# Patient Record
Sex: Male | Born: 1935 | Race: White | Hispanic: No | Marital: Married | Smoking: Current every day smoker
Health system: Southern US, Community
[De-identification: ages and names within clinical notes are randomized; demographics above are authoritative.]

## PROBLEM LIST (undated history)

## (undated) DIAGNOSIS — E119 Type 2 diabetes mellitus without complications: Secondary | ICD-10-CM

## (undated) DIAGNOSIS — M21922 Unspecified acquired deformity of left upper arm: Secondary | ICD-10-CM

## (undated) DIAGNOSIS — E559 Vitamin D deficiency, unspecified: Secondary | ICD-10-CM

## (undated) DIAGNOSIS — F17200 Nicotine dependence, unspecified, uncomplicated: Secondary | ICD-10-CM

## (undated) DIAGNOSIS — J449 Chronic obstructive pulmonary disease, unspecified: Secondary | ICD-10-CM

## (undated) DIAGNOSIS — W19XXXA Unspecified fall, initial encounter: Secondary | ICD-10-CM

## (undated) DIAGNOSIS — S72001D Fracture of unspecified part of neck of right femur, subsequent encounter for closed fracture with routine healing: Secondary | ICD-10-CM

## (undated) DIAGNOSIS — I639 Cerebral infarction, unspecified: Secondary | ICD-10-CM

## (undated) DIAGNOSIS — S32401D Unspecified fracture of right acetabulum, subsequent encounter for fracture with routine healing: Secondary | ICD-10-CM

## (undated) DIAGNOSIS — S5291XD Unspecified fracture of right forearm, subsequent encounter for closed fracture with routine healing: Secondary | ICD-10-CM

---

## 1999-06-07 ENCOUNTER — Encounter: Payer: Self-pay | Admitting: Family Medicine

## 1999-06-07 ENCOUNTER — Ambulatory Visit (HOSPITAL_COMMUNITY): Admission: RE | Admit: 1999-06-07 | Discharge: 1999-06-07 | Payer: Self-pay | Admitting: Family Medicine

## 2003-12-13 HISTORY — PX: INTRAMEDULLARY (IM) NAIL INTERTROCHANTERIC: SHX5875

## 2004-01-13 ENCOUNTER — Inpatient Hospital Stay (HOSPITAL_COMMUNITY): Admission: EM | Admit: 2004-01-13 | Discharge: 2004-01-20 | Payer: Self-pay | Admitting: Emergency Medicine

## 2007-08-20 ENCOUNTER — Ambulatory Visit: Payer: Self-pay | Admitting: Vascular Surgery

## 2007-08-20 ENCOUNTER — Encounter (INDEPENDENT_AMBULATORY_CARE_PROVIDER_SITE_OTHER): Payer: Self-pay | Admitting: Internal Medicine

## 2007-08-20 ENCOUNTER — Ambulatory Visit: Payer: Self-pay | Admitting: Cardiology

## 2007-08-20 ENCOUNTER — Inpatient Hospital Stay (HOSPITAL_COMMUNITY): Admission: EM | Admit: 2007-08-20 | Discharge: 2007-08-28 | Payer: Self-pay | Admitting: Emergency Medicine

## 2007-08-28 ENCOUNTER — Ambulatory Visit: Payer: Self-pay | Admitting: Physical Medicine & Rehabilitation

## 2007-08-28 ENCOUNTER — Inpatient Hospital Stay (HOSPITAL_COMMUNITY)
Admission: RE | Admit: 2007-08-28 | Discharge: 2007-09-07 | Payer: Self-pay | Admitting: Physical Medicine & Rehabilitation

## 2007-09-11 ENCOUNTER — Encounter
Admission: RE | Admit: 2007-09-11 | Discharge: 2007-11-01 | Payer: Self-pay | Admitting: Physical Medicine & Rehabilitation

## 2007-10-09 ENCOUNTER — Encounter
Admission: RE | Admit: 2007-10-09 | Discharge: 2008-01-07 | Payer: Self-pay | Admitting: Physical Medicine & Rehabilitation

## 2007-10-09 ENCOUNTER — Ambulatory Visit: Payer: Self-pay | Admitting: Physical Medicine & Rehabilitation

## 2007-11-30 ENCOUNTER — Ambulatory Visit: Payer: Self-pay | Admitting: Physical Medicine & Rehabilitation

## 2008-01-06 ENCOUNTER — Ambulatory Visit: Payer: Self-pay | Admitting: Internal Medicine

## 2008-01-28 ENCOUNTER — Encounter
Admission: RE | Admit: 2008-01-28 | Discharge: 2008-01-29 | Payer: Self-pay | Admitting: Physical Medicine & Rehabilitation

## 2008-01-28 ENCOUNTER — Ambulatory Visit: Payer: Self-pay | Admitting: Physical Medicine & Rehabilitation

## 2008-02-21 ENCOUNTER — Ambulatory Visit: Payer: Self-pay | Admitting: Internal Medicine

## 2008-07-17 ENCOUNTER — Encounter
Admission: RE | Admit: 2008-07-17 | Discharge: 2008-07-21 | Payer: Self-pay | Admitting: Physical Medicine & Rehabilitation

## 2008-07-21 ENCOUNTER — Ambulatory Visit: Payer: Self-pay | Admitting: Physical Medicine & Rehabilitation

## 2008-07-28 ENCOUNTER — Encounter
Admission: RE | Admit: 2008-07-28 | Discharge: 2008-10-02 | Payer: Self-pay | Admitting: Physical Medicine & Rehabilitation

## 2008-11-20 ENCOUNTER — Encounter
Admission: RE | Admit: 2008-11-20 | Discharge: 2008-11-21 | Payer: Self-pay | Admitting: Physical Medicine & Rehabilitation

## 2008-11-21 ENCOUNTER — Ambulatory Visit: Payer: Self-pay | Admitting: Physical Medicine & Rehabilitation

## 2008-12-10 ENCOUNTER — Emergency Department (HOSPITAL_COMMUNITY): Admission: EM | Admit: 2008-12-10 | Discharge: 2008-12-10 | Payer: Self-pay | Admitting: Emergency Medicine

## 2011-01-03 ENCOUNTER — Encounter: Payer: Self-pay | Admitting: Physical Medicine & Rehabilitation

## 2011-04-26 NOTE — Assessment & Plan Note (Signed)
Johnathan [name] is back regarding his left PCA stroke.  He has been taking on some  more responsibilities at home.  He has been doing work around the house  and outside.  He still has some problems with memory and awareness, but  overall is improved.  Vision has not dramatically changed.  Wife noted  some improvement with Aricept and even before he started taking the  Aricept.  He denies any pain today.  He still smokes.   REVIEW OF SYSTEMS:  Notable for the above.  Full review is in the  written health and history section in the chart.   SOCIAL HISTORY:  The patient is here with his wife who is supportive.   PHYSICAL EXAMINATION:  VITAL SIGNS:  Blood pressure is 124/59, pulse is  86, respiratory rate is 18, and he is sating 99% on room air.  GENERAL:  The patient is pleasant, alert, and oriented x3.  Affect is  bright and appropriate.  Balance is stable.  He still has some issues to  the left today.  Vision remains impaired to the right with 5 or 10  degrees intact past midline.  HEART:  Regular.  CHEST:  Clear.  ABDOMEN:  Soft and nontender.  The patient does better memory and  insight, but continues to have problems with sequencing and overall  awareness.  He likes to laugh his way through a lot of questions and  answers.   ASSESSMENT:  Left posterior cerebral artery stroke.   PLAN:  1. Continue Aricept 5 mg at bedtime.  2. Encouraged problem solving activities at home to push his memory      and cognition.  3. Could benefit from prism lenses for glasses, but these will not      give him the ability to drive.  4. I will see him back on as needed basis.  He will follow up with Dr.      Jeannetta Nap for his ongoing medical needs.      Ranelle Oyster, M.D.  Electronically Signed     ZTS/MedQ  D:  11/21/2008 15:35:19  T:  11/22/2008 03:02:30  Job #:  [id number]   cc:   Windle Guard, M.D.  Fax: [phone]

## 2011-04-26 NOTE — H&P (Signed)
NAMETANMAY, HALTEMAN NO.:  [id number]   MEDICAL RECORD NO.:  [id number]          PATIENT TYPE:  INP   LOCATION:  2001                         FACILITY:  [facility]   PHYSICIAN:  Della Goo, M.D. DATE OF BIRTH:  05-Apr-1936   DATE OF ADMISSION:  08/20/2007  DATE OF DISCHARGE:                              HISTORY & PHYSICAL   CHIEF COMPLAINT:  Confusion.   HISTORY OF PRESENT ILLNESS:  This is a 75 year old man who presented to  the emergency department secondary to complaints of severe headache and  confusion which started at about noon.  His wife is with him and  describes that he began to have a glazed look and was confused.  The  patient's wife reports that she thought his blood sugar may have been  low, so she fed him orange juice.  She did check his blood sugar and it  was found to be 176 at that time.  She also reports that he had anomia  and was unable to recognize simple things.  He also had apraxia.  She  attempted to persuade him to go to the hospital, however the patient  refused to go.  He did not report to the hospital until the next day.  His symptoms did not improve.  The patient denies having any problems  with his balance.  He does report having problems with his memory.  He  denies having any weakness in his arms or legs.  The patient reported  having a severe headache in the frontal area, throbbing.  He reports  taking 1000 mg of Tylenol three times during the past 24 hours without  relief.  He also took seven to eight 81 mg aspirin which were without  relief as well.  He denies having any chest pain or shortness of breath,  however does report having nausea and nausea or vomiting.  He denies  having any bowel or bladder changes.   PAST MEDICAL HISTORY:  1. Significant for two previous cerebrovascular accidents.  2. Type 2 diabetes mellitus.  3. Glaucoma.  4. Hyperlipidemia.  5. Neuropathy.  6. Spinal stenosis.   PAST SURGICAL HISTORY:  1. Significant for right hip repair.  2. History of repair of the left wrist drop.  This is secondary to      what sounds like Erb's palsy.   MEDICATIONS:  1. Avandia 8 mg 1 p.o. daily.  2. Altace 1 tablet p.o. daily - the dose needs to be verified.  3. Glucovance 5/500 two p.o. b.i.d.  4. Aspirin 81 mg 1 p.o. daily.  5. Lipitor 10 mg 1 p.o. daily.  6. Gabapentin 400 mg 1 p.o. q.a.m. and 300 mg p.o. at lunch and at      dinner.  7. The patient also has Xalatan eye drops 1 drop in both eyes at      night.  8. Byetta injections twice daily.   SOCIAL HISTORY:  The patient is married.  He is a retired Airline pilot.  He has a remote history of tobacco abuse, quit 7 years ago.  No history  of alcohol usage.   FAMILY HISTORY:  Noncontributory.   REVIEW OF SYSTEMS:  Pertinents are mentioned above.   PHYSICAL EXAMINATION:  GENERAL APPEARANCE:  This is a 75 year old well-  nourished, well-developed male in no acute distress.  VITAL SIGNS:  Stable.  Temperature 97.4, blood pressure 155/85, heart  rate 88, respirations 18, O2 saturation is 96% on room air.  HEENT: Normocephalic, atraumatic.  Pupils equally round reactive to  light.  Extraocular muscles are intact.  Funduscopic benign.  Oropharynx  is clear.  NECK:  Supple.  Full range of motion.  No thyromegaly, adenopathy,  jugular venous distention.  CARDIOVASCULAR:  Regular rate and rhythm.  No murmurs, gallops or rubs.  CHEST:  Lungs are clear to auscultation bilaterally.  ABDOMEN:  Positive bowel sounds, soft, nontender, nondistended.  EXTREMITIES: Without cyanosis, clubbing or edema.  NEUROLOGIC:  The patient is alert x1.  His speech is clear.  His cranial  nerves are intact.  There is no facial drooping.  No tongue deviation.  There are no motor or sensory deficits.  However, a Mini-Mental Status  Examination has been performed, results of which do reveal poor  memory/poor recall.  He also has difficulty with concentrating.  The   patient is an Airline pilot, however has had difficulty with subtraction of  simple numbers.   LABORATORY STUDIES:  White blood cell count 5.3, hemoglobin 11.9,  hematocrit 35.6, MCV 92.9, platelets 150, neutrophils 65% lymphocytes  22%.  Protime 12.7, INR 0.9, PTT 28.  CT scan of the head was performed  revealing acute/subacute cerebrovascular accident of the left posterior  cerebral artery territory affecting the posterior medial temporal lobe  and occipital lobes.  Old small-vessel strokes present.  No hemorrhage.   ASSESSMENT:  66. A 75 year old male being admitted with a subacute cerebrovascular      accident of the left posterior cerebral artery area.  2. Hypertension.  3. Type 2 diabetes mellitus.  4. Hyperlipidemia.  5. Normocytic anemia.   PLAN:  The patient will be admitted to a neurology area for neurologic  monitoring.  Neuro checks have been ordered.  The patient will also have  cardiac monitoring and cardiac enzymes will be performed.  A speech  swallowing evaluation has been ordered.  PT and OT evaluations have also  been ordered as well.  The patient will have further studies.  A carotid  ultrasound study has been ordered along with an MRI and MRA study.  DVT  and GI prophylaxis have been ordered.  His metformin therapy has been  held.  His regular medications have been ordered otherwise and sliding  scale insulin coverage has been ordered as needed for elevated blood  sugars.  A neurology consultation will be placed.      Della Goo, M.D.  Electronically Signed     HJ/MEDQ  D:  08/21/2007  T:  08/21/2007  Job:  [id number]

## 2011-04-26 NOTE — Assessment & Plan Note (Signed)
The patient is back regarding left PCA strokes.  The patient is doing a  bit better as far as his cognition is concerned.  His smoking has taken  a step back.  He is unable to tolerate the Chantix as it caused  depression.  He has backed off the Neurontin a bit.  His wife states  that he is still concerned about his independence and ability to drive.  [name], M.D. had mentioned Nuromax visual field stimulation  program.  Tyrone Apple. Karleen Hampshire, M.D. was in favor of this as a helpful  modality.  The plan is not covered by insurance and costs upwards of  $4,000.   REVIEW OF SYSTEMS:  Notable for the above.  Full review is in the  written health and history section of the chart.   SOCIAL HISTORY:  Without specific change.  The patient is married and  wife works and is very supportive.   PHYSICAL EXAMINATION:  VITAL SIGNS:  Blood pressure 173/78, pulse 90,  respiratory rate 18, and saturations 97% on room air.  GENERAL:  The patient is pleasant, alert to month and year but not date.  He lacks some awareness and insight.  He has inability to focus.  He  likes to joke and play around, but often is hard to keep on task.  HEART:  Regular rate and rhythm.  LUNGS:  Clear.  ABDOMEN:  Soft and nontender.  EXTREMITIES:  The patient has stable strength.  The left arm remains  weaker.  He continues to have a right homonymous hemianopsia.  Sensory  examination is intact.   ASSESSMENT:  Left PCA infarct with right hemisensory loss, mild  hemiparesis, and visual field cut.  The patient with ongoing cognitive  and language deficits.   PLAN:  1. Continue Aricept 10 mg daily.  2. Taper off cortisone as blood pressure is increasing.  He should be      fine off of this.  3. Encourage regular reading and language exercises.  4. I doubt that the Neuromax program for his visual fields would      improve him enough to get to the point where he could drive a motor      vehicle.  He is unsafe to  drive, not only from a visual standpoint,      but from a cognitive language standpoint as well.  5. Encourage more goal oriented behaviors.  6. I will see him back in about six months.  We did recommend Nicoderm      patch along with his inhaler to try for smoking cessation.      Ranelle Oyster, M.D.  Electronically Signed     ZTS/MedQ  D:  01/29/2008 11:07:53  T:  01/29/2008 23:53:50  Job #:  [id number]   cc:   Windle Guard, M.D.  Fax: [phone]

## 2011-04-26 NOTE — Letter (Signed)
February 21, 2008    Johnathan P. Pearlean Brownie, MD  [address]  [place], [place] [address]   RE:  Johnathan [name], Johnathan [name]  MRN:  [id number]  /  DOB:  06-27-1936   Dear Johnathan [name]:   It was a pleasure to see Johnathan [name] at your request because of the  abnormalities on his event recorder.   As you know, he is a 75 year old gentleman who had a stroke in September  2008 that was complicated by significant loss of memory for the  preceding 20 years according to his wife.   In attempts to try to identify the cause of the stroke, he underwent  atrial fibrillation detection monitoring using a CardioNet monitor and  he was found to have 2 arrhythmias prompting this consultation.   He has normal heart function by echo.  He does have a history of  diabetes and hypertension.  He has no known myocardial infarction.  He  has no nocturnal dyspnea,  peripheral edema, orthopnea or dyspnea on  exertion.   PAST MEDICAL HISTORY:  Notable for orthostatic intolerance which is much  improved.  He also had this years ago that was attributed to diabetes  and spinal arterial thrombosis.  In addition to the above history, his  past medical history is notable for gout and fatigue.   PAST SURGICAL HISTORY:  Notable for right hip fracture and surgery of  his left wrist.   SOCIAL HISTORY:  He is married.  He has 1 child.  He does use  cigarettes.  He stopped for a while prior to the stroke and has resumed.  He does not use alcohol or recreational drugs.  He walks.   PHYSICAL EXAMINATION:  VITAL SIGNS:  Blood pressure today was 127/80,  his pulse was 83.  His weight was 191.  GENERAL:  He is an elderly Caucasian male in no acute distress.  He was  quite anxious and described feeling like his heart was fluttering away  every time I walked into the room, but also before I got to the room.  HEENT:  Demonstrated no icterus or xanthoma.  NECK:  Veins were flat.  The carotids are brisk and full bilaterally  without bruits.  BACK:  Without kyphosis or scoliosis.  LUNGS:  Clear.  HEART:  Sounds were regular with an S4.  ABDOMEN:  Soft with active bowel sounds without midline pulsation or  hepatomegaly.  EXTREMITIES:  Femoral pulses were 2+.  Distal pulses were  intact.  There is no clubbing, cyanosis or edema.  NEUROLOGIC:  Grossly normal.  SKIN:  Warm and dry.   Event recording demonstrated 2 arrhythmias.  One was nonsustained  ventricular tachycardia with cyclings down to 400 milliseconds.  The  other was an atrial tachycardia, nonsustained of about 12 seconds  duration.   IMPRESSION:  1. Prior stroke.  2. CardioNet monitoring demonstrating atrial tachycardia, but not      atrial fibrillation and nonsustained ventricular tachycardia.  3. Normal left ventricular function by echo.  4. Thromboembolic risk factors including diabetes and hypertension,      but not clearly related to his event.  5. Significant aortic atheroemboli.  6. History of orthostatic intolerance - improving.   Johnathan, Johnathan [name] has a variety of arrhythmias.  The significance of  which is not clear.  Nonsustained ventricular tachycardia in the setting  of normal left ventricular function is probably not particularly  concerning as the risks of sudden death in this co-worker otherwise  are  very low anyway.  Making sure his electrolytes are normal would be  appropriate.   Atrial tachycardia is not clearly a thromboembolic generating rhythm.  While it may be a harbinger of atrial fibrillation, I think it is  probably premature to think about anticoagulation with  Coumadin.  I will have to look back, but my recollection is that  atheroemboli in the aorta may be made more friable by Coumadin.   I have reviewed the above with him.  If there is any further I can do to  help you in your care of him, please do not hesitate to contact me.    Sincerely,      Duke Salvia, MD, [facility]  Electronically Signed    SCK/MedQ  DD:  02/21/2008  DT: 02/23/2008  Job #: [id number]

## 2011-04-26 NOTE — Consult Note (Signed)
Johnathan [name], Johnathan [name] NO.:  [id number]   MEDICAL RECORD NO.:  [id number]          PATIENT TYPE:  INP   LOCATION:  2001                         FACILITY:  [facility]   PHYSICIAN:  Gustavus Messing. Orlin Hilding, M.D.DATE OF BIRTH:  1936-02-16   DATE OF CONSULTATION:  08/20/2007  DATE OF DISCHARGE:                                 CONSULTATION   CHIEF COMPLAINT/REASON FOR CONSULTATION:  Headache, confusion and nausea  with an MRI showing acute left PCA infarct.   HISTORY OF PRESENT ILLNESS:  Johnathan [name] is a 75 year old right-handed  white man with a history of previous stroke in the past including a  spinal artery thrombosis.  He has been treated by Dr. Dwaine Gale  in the past.  I do not believe he was seen here for his stroke  syndromes.  He made a fairly full recovery from previous strokes.  He  has been on low-dose aspirin, although he has been on Plavix in the  past.  He presented early this morning after a day long course yesterday  of headache, confusion and nausea.  MRI showed a left PCA infarct.   REVIEW OF SYSTEMS:  A complete 12-system review performed is positive  for dyspnea on exertion.  Previous stroke is noted, left arm weakness  due to birth injury, nausea, urinary urgency, anemia, vision problems.   PAST MEDICAL HISTORY:  Significant for:  1. Multiple strokes in the past.  2. Non-insulin-dependent diabetes.  3. Hypertension.  4. Hypercholesterolemia.  5. History of a previous spinal arterial thrombosis with post-stroke      pain and a band across the torso.  6. 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  7. Remote right hip intertrochanteric fracture and right humerus      fracture, nondisplaced.  He had an ORIF of the right hip done.  8. He has chronic anemia.  9. Peripheral edema of the left lower extremity.   MEDICATIONS ON ADMISSION:  1.  Gabapentin 300 at lunch and dinner and 400 in the morning.  2. Avandia 8 mg one a day.  3. Altace 1.25 mg daily.  4. Lipitor 10 mg daily.  5. Aspirin 81 mg daily.  6. Xalatan eye drops daily.  7. Byetta 210 mg/mL twice a day subcutaneously.   ALLERGIES:  No known drug allergies.   SOCIAL HISTORY:  He is married.  He is a remote smoker.   FAMILY HISTORY:  Positive for stroke.   OBJECTIVE:  VITAL SIGNS:  On exam, temperature is 97.2, pulse 85,  respirations 18, BP 180/96 (which is elevated).  HEAD:  Normocephalic, atraumatic.  NECK:  Without bruits.  HEART:  Regular rate and rhythm.   DATA:  On the NIH stroke scale, he is alert, gets 0 points, answers 1  out of 2 questions correctly (gets 1 point), follows 2 out of 2 commands  correctly (gets zero points), extraocular movements are full (gets zero  points),  has a complete right homonymous hemianopsia (gets 2 points),  has no facial droop (gets zero points), no drift in either upper or  lower extremity (gets zero points for each of those), no ataxia (gets  zero points), no sensory loss (gets zero points), has a mild-to-moderate  aphasia (gets 1 point), no dysarthria (gets zero points), no extinction  (gets zero points).  Total score is 4 for lack of answering questions  correctly, dense visual field cut and a mild-to-moderate aphasia.  MRI  shows an acute left PCA distribution infarct with PCA cut off on MRA and  multiple old cortical and subcortical bilateral infarcts, many of which  appear to be branch pattern-type strokes.   IMPRESSION:  Left PCA occipital infarct with multiple old branch-pattern  infarcts.  The pattern is not typical of most small vessel strokes  (although there is some of that present), despite his risk factors.  This is concerning for a cardio- or aortoembolic event.   RECOMMENDATIONS:  Would change aspirin to Plavix.  I had earlier  recommended Aggrenox, but with severe headache and dizziness currently,   he probably will not tolerate Aggrenox right now, as those are both  common and significant side effects.  We will check MRA of the neck for  vertebral disease proximally.  We will check a coagulopathy profile.  He  will probably need transesophageal echo, which will need to be set up  directly with Cardiology, to look closely for a cardiac source.  I would  get PT, OT and ST involved.      Catherine A. Orlin Hilding, M.D.  Electronically Signed     CAW/MEDQ  D:  08/20/2007  T:  08/21/2007  Job:  [id number]

## 2011-04-26 NOTE — Discharge Summary (Signed)
Johnathan [name], CAPERS NO.:  [id number]   MEDICAL RECORD NO.:  [id number]          PATIENT TYPE:  INP   LOCATION:  2001                         FACILITY:  [facility]   PHYSICIAN:  Marcellus Scott, MD     DATE OF BIRTH:  13-Sep-1936   DATE OF ADMISSION:  08/20/2007  DATE OF DISCHARGE:                               DISCHARGE SUMMARY   INTERIM SUMMARY:   DISCHARGE DIAGNOSES:  1. Acute left PCA infarct with language and cognitive difficulties.  2. Aspiration pneumonia.  3. Acute on chronic diastolic congestive heart failure.  4. Chronic obstructive pulmonary disease.  5. Altered mental status.  6. Diabetes.  7. Hypertension.  8. Hyperlipidemia.  9. Aspiration risk.  10.Thrombocytopenia, improving.  11.Obesity.  12.Acute on questionable chronic kidney disease.   DISCHARGE MEDICATIONS:  His discharge medications are to be dictated at  discharge.   PROCEDURES:  1. Modified barium swallow on August 23, 2007.  2. Chest x-ray on August 23, 2007 with impression of improvement in      vascular congestion.  There remains bibasilar air space disease and      a small right effusion.  3. August 22, 2007 chest x-ray with impression of cardiomegaly and      probable mild edema.  4. MRA of the neck without and with contrast with impression:      a.     No significant carotid stenosis.      b.     Severely diseased vertebral artery proximally with       irregularity in the mid and distal left vertebral artery which may       end in PICA.  This may be due to atherosclerotic disease or       possibly dissection.  Catheter angioplasty may be helpful for       further evaluation of the left vertebral artery to determine if       dissection is present.  5. August 20, 2007, portable chest x-ray with impression no active      disease.  6. August 20, 2007, CT of the head without contrast.  Impression of:      a.     Acute/subacute stroke in the left posterior cerebral  artery       territory without evidence of hemorrhage.      b.     Multiple old large and small vessel strokes elsewhere.  7. MRI of the brain without contrast.  Impression is:      a.     Acute left PCA territory infarct.      b.     Advanced chronic large and small vessel ischemic disease.  8. MRA of the head with impression of:      a.     Occluded left PCA in its midsection correlating to acute       stroke.      b.     Atherosclerosis involving the carotid siphons with mild to       moderate stenosis on the right.      c.  Ectasia of the basilar artery tip and proximal right PCA       without discrete aneurysm formation.  9. Transesophageal echocardiogram.  Summary is overall left      ventricular systolic function was normal.  Left ventricular      ejection fraction range 55-60%.  No diagnostic evidence of LV      regional wall motion abnormalities.  Aortic valve thickness mildly      increased.  Moderately extensive, moderate, sessile atheroma of the      descending aorta.  Mild mitral valve regurgitation.  The left      atrial appendage function was normal.  No diagnostic evidence for      left atrial appendage thrombus.  There was evidence of a patent      foramen ovale versus fenestrated intra-atrial septum based on      contrast study with right to left shunting.  No diagnostic evidence      for right atrial appendage thrombus.  Mild right to left shunt at      rest by contrast study with agitated saline.  10.transthoracic echocardiograms.  Summary was a technically difficult      study.  Overall LV systolic function normal.  LV ejection fraction      of 55%.  Study was inadequate for evaluation of LV regional wall      motion.  LV wall thickness mildly increased.  Left atrium      moderately dilated.  Possible small free flowing echo-free      pericardial effusion at the apex.  11.Venous Doppler of the bilateral lower extremities.  Impression was      no evidence of  DVT, superficial thrombosis or Baker's cyst.  12.Carotid Doppler bilaterally with mild mixed plaque throughout      bilaterally.  Vertebral artery flow antegrade bilaterally.  40 to      60% right ICA stenosis.  No significant left ICA stenosis.   LABORATORY DATA:  His pertinent labs are to be finalized on actual  discharge.  Hypercoagulable panel negative.  Blood cultures negative to  date.  Urine cultures negative.  BNP of 490 on September 10th, TSH of  4.312, hemoglobin A1c of 7.2.  ESR of 25.   CONSULTATIONS:  1. Neurology, Drs. Catherine A. Orlin Hilding and Sethi.  2. Cardiology, Dr. Jonelle Sidle.  3. Physical therapy, occupational therapy and speech therapy.  4. Inpatient rehab.   HOSPITAL COURSE AND PATIENT DISPOSITION:  For details of the initial  admission, please refer to the history and physical note.   In summary, Mr. 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  Further evaluation in the  emergency room was indicative of subacute to acute left posterior  cerebral artery infarct.   The patient was thereby admitted to a telemetry bed for further  evaluation and management.  There were no significant alarms on the  telemetry except a few PVCs.  The patient has since had an extensive  evaluation as indicated above.  He was started on full dose aspirin  initially which was switched to Plavix.  The patient could not be  started on Aggrenox because of his headache on presentation.  Speech  therapy evaluated him and initially recommended a regular diet with thin  liquids and ADA restrictions.  The patient, over the next couple of  days, continued to complain of frontal persisting 3/10 headaches which   might have been secondary to his stroke.  He also had intermittent  periods of agitation, confusion, anomia and altered mental status.   For his hyperglycemia, the patient was started on Lantus with a fair  control at this time. Subsequently on the 20th of September, the patient  was noted to be dyspneic and further evaluation was suggestive of  aspiration pneumonia, mild CHF and COPD exacerbation.  The patient was  started empirically on Zosyn IV for presumptive aspiration pneumonia, IV  Lasix, nebulizer treatments and oxygen.  With these measures, the  patient's respiratory status has significantly improved with occasional  crackles in the bases, left more than the right.  The patient has been  afebrile.  His cultures are so far negative.  The patient is to complete  a total of one week's course of IV Zosyn.  Because of his suspected  aspiration pneumonia, speech therapy proceeded to do a modified barium  swallow and then changed diet to dysphagia 3 diet, nectar thick liquids.  He patient has progressively improved.  He is much more awake, alert and  interactive.  Speech therapy has evaluated him and indicated that he has  moderate receptive and expressive aphasia.  He also has severe anomia,  intermittent neologisms and paraphasias.  He is able to follow one and  two-step commands.  He has cognitive impairments with inconsistent  awareness of errors.  He might have questionable ideomotor apraxia.  He  has right dense homonymous hemianopia.  Since then, the patient has also  been evaluated for inpatient rehab and has been accepted and are waiting  for bed availability.  The patient should be able to transfer to  inpatient rehab in the next day or two.   For mild thrombocytopenia, his CBCs were closely followed.  This might  have been secondary to his pneumonia and the antibiotics.  But the  platelets are now back up to 144,000 and this has to be monitored.  The  patient also had bump  in his creatinine from about 1.24 range to 1.69 at  this time which may be secondary to his diuresis and the inadequate p.o.  intake.  Will reduce his oral Lasix dose and then monitor his basic  metabolic panels.      Marcellus Scott, MD  Electronically Signed     AH/MEDQ  D:  08/25/2007  T:  08/26/2007  Job:  [id number]   cc:   Santina Evans A. Orlin Hilding, M.D.  Jonelle Sidle, MD  Windle Guard, M.D.

## 2011-04-26 NOTE — Assessment & Plan Note (Signed)
Johnathan [name] is back regarding his left PCA stroke.  His wife seems to think  that the Aricept helped him a bit at home improving memory.  He is still  fixated on smoking and has actually returned to smoking despite pleas  otherwise.  He was given some Nicotrol through his family doctor's  office, and this has helped to a certain extent.  He has been having  some problems sleeping at night and generally sleeps from 5 until 12:00  in the afternoon.  He is up most of the night apparently, per his wife.  Johnathan [name] can do some simple tasks at home.  He is able to answer the phone  and take messages fairly accurately.  Continues on Plavix for stroke  prophylaxis.  He is on Neurontin for peripheral neuropathy.  Less  irritable behavior has been noted per his wife.   REVIEW OF SYSTEMS:  Notable for confusion, as noted above, as well as  some high sugars at times.  He has been sick recently with a stomach  virus apparently.  Other pertinent positives listed above and full  review is in the health and history section of the chart.   SOCIAL HISTORY:  The patient is married and living with his wife.  He is  using a Nicotrol inhaler as noted above.   PHYSICAL EXAM:  Blood pressure 130/62, pulse 109, respiratory rate 20.  He is satting 97% on room air.  The patient is generally pleasant.  He is alert to name.  He can give me  the date with multiple cues.  He is able to tell me the month.  He is  able to do some simple organizing with cueing.  He still lacks attention  and focus, and appears to have some receptive language deficits still.  Mood was generally pleasant, however.  He moves all 4 extremities with  near 5/5 strength.  Sensory exam is grossly intact.  He has a right  homonymous hemianopsia still as well.  HEART:  Tachycardic.  CHEST:  Clear.  ABDOMEN:  Soft and nontender.   ASSESSMENT:  Left posterior cerebral artery infarct with right  hemisensory loss and mild hemiparesis, which generally has  improved.  Continues to have visual and cognitive/language deficits.  The Aricept  has helped.   PLAN:  1. Continue Aricept at 10 mg a day dose.  We will change this to a.m.      to see if this helps his sleep patterns.  If it does not, we will      need to look at a sleep agent to assist him in this problem.  2. Begin Chantix trial for smoking cessation.  3. Consider Ritalin for attention.  I will hold off on this for now.  4. Continue Neurontin for neuropathic pain.  5. In general, Johnathan [name] seems to be making some progress.  I will see him      back in about 2 months' time.      Johnathan [name], M.D.  Electronically Signed     ZTS/MedQ  D:  12/03/2007 13:02:33  T:  12/03/2007 15:27:09  Job #:  [id number]   cc:   Johnathan Codding, MD,FACC  [address]  [place], [place] [address]

## 2011-04-26 NOTE — Discharge Summary (Signed)
NAMENEWTON, Johnathan [name]   MEDICAL RECORD NO.:  [id number]          PATIENT TYPE:  IPS   LOCATION:  4004                         FACILITY:  [facility]   PHYSICIAN:  Johnathan [name], M.D.DATE OF BIRTH:  [birth year]   DATE OF ADMISSION:  08/28/2007  DATE OF DISCHARGE:  09/07/2007                               DISCHARGE SUMMARY   DISCHARGE DIAGNOSES:  1. Left PCA infarct.  2. Orthostasis, much improved.  3. Dehydration, resolving.  4. Diabetes mellitus type 2.  5. Dysphasia, resolved.   HISTORY OF PRESENT ILLNESS:  Johnathan [name] is a 75 year old male with  history of diabetes mellitus and prior CVA, admitted on September 8 with  headaches, confusion and anomaly of one-day duration.  MRI/MRA of brain  showed acute left PCA territory infarct with advanced chronic large and  small vessel disease, occluded left PCA mid section, and mild to  moderate atherosclerosis of right carotid siphon.  Carotid Doppler  showed mild plaque bilaterally, 40-60% right ICA stenosis.  A 2-D echo  showed EF of 55%.  TEE done showed EF of 55-60% with extensive moderate  atheroma in descending aorta, PFO versus a fenestrated intra-arterial  septum with right to left bubble shunting, no thrombus.  Bilateral lower  extremity Doppler showed no evidence of DVT.  MRA of neck showed  severely disease left vertebral artery.  As patient with complaints of  headaches since admission, Plavix was recommended for CVA prophylaxis by  neurology.  The patient was noted to have fevers with a temperature of  102 on September 10, started on IV Zosyn for question bilateral  bibasilar air space disease.  Also noted to have vascular congestion  treated with Lasix.  Swallow evaluation showed dysphagia with  penetration of thin liquids.  The patient was placed on DT diet with  nectar liquids.  Currently, the patient continues with right homonymous  hemianopsia, moderate to receptive  greater than  expressive aphasia with  intermittent neologism, paraphasias.  Noted to have problems with  orthostasis.  Was started on Florinef for this.  Rehab consulted for  progressive therapies.   PAST MEDICAL HISTORY:  1. Significant for CVA x1 and prior history of spinal artery      thrombosis.  2. Diabetes mellitus.  3. Spinal stenosis.  4. Glaucoma.  5. History of right hip ORIF.  6. Right humerus fracture past fall on ice in 2005.  7. 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  8. History of left lower extremity edema.  9. COPD.  10.Chronic anemia.   ALLERGIES:  NO KNOWN DRUG ALLERGIES.   FAMILY HISTORY:  Positive for CVA.   SOCIAL HISTORY:  The patient is married, lives in one level home with  rapid entry.  Quit tobacco prior to 7 years ago.  Does not use any  alcohol, was independent for ADLs and ambulation.  Does not drive.   HOSPITAL COURSE:  Johnathan [name] was admitted to rehab on September June 27, 2007 for inpatient therapies to  consist of PT, OT daily.  The  patient was maintained on IV Zosyn past admission.  Chest x-ray done on  September 17 showed improvement in basal aeration.  As chest x-ray with  improvement, Zosyn was discontinued on September 18, and the patient was  treated with Levaquin 500 mg per day for 1 week.  Labs done past  admission revealed hemoglobin 11, hematocrit 33.1, white count 8.9,  platelets 159.  Check of electrolytes revealed a sodium 132, potassium  4.3, chloride 104, CO2 30, BUN 19, creatinine 1.55, glucose 183.  LFTs  within normal limits.  Secondary to renal insufficiency, the patient's  Glucophage was placed on hold.  He was started on Januvia with dose  titrated to 100 mg p.o. per day.  The patient's blood sugars have been  monitored on a.c./h.s. basis during this stay and currently ranging from  140s-180s.  Blood pressures checked on b.i.d. basis have ranged from  120s-130s systolic, 50s-70s  diastolic.  Heart rate stable.  The patient  has been continent of bowel and bladder.  The patient has had issues  with dizziness and orthostasis.  Abdominal binder and TEDS were used  initially to help support blood pressure.  Florinef was also increased  to 0.25 mg p.o. q.a.m., in addition 0.1 mg p.o. q.p.m.  The patient was  noted to have problems maintaining hydration on his nectar-thick liquids  secondary to dislike of thickened liquids.  Check of electrolytes of  September 25 revealed worsening renal status with BUN 25, creatinine of  2.2.  He was started on IV fluids continuously for hydration.  A repeat  swallow study also done on September 25, advanced the patient to regular  diet, thin liquids.  Expect with regular liquids, the patient's  hydration should improved greatly past discharge.  Check of electrolytes  of September 26 shows improvement in renal status with BUN at 15 and  creatinine of 1.56.  The patient's strength of bilateral lower  extremities were grossly within functional limits.  Left upper extremity  is impaired secondary to prior fractures and congenital deformity.  The  patient's awareness of deficits is improving.  ADL training has focused  on attention, short-term memory as well as sequencing and organizing  basic self-care tasks.  The patient has been able to tolerate sitting at  edge of bed without TEDS and binder.  He is able to ambulate 160 feet  with min assist to supervision level, able to navigate __________ at  supervision level.  He does continue with severe deficits in auditory  comprehension, memory and verbal expression as well as problems with  problem solving.  He is able to express basic wants and needs and follow  one-step commands.  Verbal output is marked by circumlocution and word  finding difficulty.  The patient will continue to have further follow-up  outpatient PT, OT and speech therapy at [facility] outpatient rehab  beginning September  30.  On September 07, 2007, the patient is  discharged to home.   DISCHARGE MEDICATIONS:  1. Plavix 75 mg 1 p.o. per day.  2. Neurontin 400 mg in a.m., 300 mg at noon and at h.s.  3. Lipitor 10 mg every night.  4. Xalatan eye drops 1 drop OU every night.  5. Januvia 100 mg a day.  6. Florinef 0.1 mg 2 p.o. in a.m. and 1 in the p.m. for the next 2-3      weeks as dizziness symptoms improve, decrease to 1 p.o.  b.i.d.  7. DiaBeta 5 mg p.o. q.a.m.  8. Byetta 5 mg subcutaneous b.i.d.   DIET:  Is diabetic diet.   ACTIVITY LEVEL:  Is 24-hour supervision.  No strenuous activity.   SPECIAL INSTRUCTIONS:  Continue wearing abdominal binder and stockings  when out of bed as dizziness symptoms improve over the next few weeks.  Discontinue stockings and abdominal binder to not use Glucovance,  Avandia, Altace or aspirin.   FOLLOW UP:  The patient to follow up with Dr. Riley Kill on October 29 at  11:40.  Follow up with Dr. Jeannetta Nap for routine check in 2 weeks.  Follow  up with Dr. Pearlean Brownie in 4 weeks.  Follow up with Dr. Andee Lineman as needed.      Greg Cutter, P.A.      Johnathan [name], M.D.  Electronically Signed    PP/MEDQ  D:  09/07/2007  T:  09/08/2007  Job:  [id number]   cc:   Santina Evans A. Orlin Hilding, M.D.  Learta Codding, MD,FACC

## 2011-04-26 NOTE — H&P (Signed)
NAMEDARRILL, VREELAND NO.:  [id number]   MEDICAL RECORD NO.:  [id number]          PATIENT TYPE:  IPS   LOCATION:  4004                         FACILITY:  [facility]   PHYSICIAN:  Ranelle Oyster, M.D.DATE OF BIRTH:  August 22, 1936   DATE OF ADMISSION:  08/28/2007  DATE OF DISCHARGE:                              HISTORY & PHYSICAL   CHIEF COMPLAINT:  Confusion, aphasia, weakness.   HISTORY OF PRESENT ILLNESS:  This is a 75 year old white male with  history of diabetes and prior strokes admitted on September 8 with  headache and confusion as well as anomia x1 day.  MRI/MRA revealed acute  left PCA infarct with advanced chronic large and small vessel disease.  The patient had occluded left PCA mid section mild to moderate  atherosclerosis right carotid siphon.  Carotid Dopplers with mild  plaques.  There was 40-60% right ICA stenosis noted.  A 2-D  echocardiogram was remarkable for extensive moderate sessile atheroma in  descending aorta.  There was a patent foramen ovale versus fenestrated  intra-atrial septum with right-to-left bubble shunting.  Bilateral lower  extremity Doppler workup was done without evidence of DVT.  Plavix was  recommended for stroke prophylaxis by neurology.  The patient developed  post admission fever to 102 for which she was started on IV Zosyn.  Chest x-ray only revealed vascular congestion for which the patient was  diuresed.  Swallow evaluation revealed the penetration of thin liquids  and oral apraxia affecting mastication and propulsion of solid foods.  The patient has ongoing issues with language as well as swallow in  addition to his apraxia.  Therefore, the patient was then admitted to  the inpatient rehab unit today to improve upon these functional and  medical problems.   REVIEW OF SYSTEMS:  Is notable for the above.  The patient reports good  sleep.  Wife reports adequate appetite.  He has been moving his bowels  and bladder  continently.  Denies shortness of breath at rest, though he  has had some with activity.  Full review is in the written H&P.   PAST MEDICAL HISTORY:  Is positive for:  1. Stroke.  2. History of spinal artery thrombosis as well.  3. Diabetes.  4. Spinal stenosis.  5. Glaucoma.  6. History of right hip ORIF for a humeral fracture in 2005.  7. 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  8. Chronic anemia.  9. COPD.  10.Peripheral edema on the upper extremity.   FAMILY HISTORY:  Positive for stroke   SOCIAL HISTORY:  The patient is married.  He lives in a one-level house  with his wife.  Wife is supportive.  The patient quit tobacco 7 years  ago.  He does not drink.   FUNCTIONAL HISTORY:  Patient independent for ADLs and ambulation.  Does  not drive.   ALLERGIES:  None.   HOME MEDICATIONS:  1. Neurontin 400 mg q.a.m., 300 mg b.i.d.  2. Avandia 8 mg daily.  3. Altace 1.25 mg daily.  4. Lipitor 10 mg daily.  5. Aspirin 81 mg  daily.  6. Byetta b.i.d. 250 mcg.  7. Xalatan daily.  8. Glucovance 5/500 daily.   LABORATORY DATA:  Hemoglobin 11.3, white count 5.1, platelets 142,000.  Sodium 145, potassium 4.1, BUN 21, creatinine 1.5.   PHYSICAL EXAMINATION:  VITAL SIGNS:  Blood pressure is 128/68, pulse 71,  respiratory 20, temperature 98.5.  GENERAL:  The patient is pleasant, alert and appropriate for the most  part.  HEENT:  Pupils equal, round, and reactive to light.  Nose and throat  exam was remarkable for pink and moist mucosa.  NECK:  Supple without JVD or lymphadenopathy.  CHEST:  Clear to auscultation bilaterally except for a few rales at the  bases.  HEART:  Regular rate and rhythm without murmur, rub, or gallops.  EXTREMITIES:  Showed no clubbing, cyanosis or edema.  ABDOMEN:  Soft, nontender.  Bowel sounds were positive.  NEUROLOGIC:  Cranial nerves II-XII show no gross abnormalities.  He may  have had a right tongue deviation.  Speech was clear.  He did  have  definite motor and verbal apraxia. He had mixed expressive and receptive  language deficit as well.  The patient could follow commands was  repeated cuing and questioning.  He is much better with automatic  language and movements.  Motor function was really symmetrical with 4+-  to 5/5 in lower extremities.  Left upper extremities chronically weak  and at baseline.  Right upper extremity strength was 4+ to 5/5.  The  patient had no focal light touch or proprioceptive deficits.  Memory  difficult to assess due to language problems.  Mood was generally  pleasant,   ASSESSMENT/PLAN:  1. Functional deficits secondary to left posterior cerebral artery      infarct with minimal left hemiparesis, dysphagia, apraxia, and      aphasia.  Begin comprehensive inpatient rehab with PT to assess and      treat range of motion strengthening, gait and safety.  OT will      assess and treat for range of motion strengthening, cognitive and      perceptual training, and equipment.  Speech language pathology will      follow for swallowing and language deficits.  Rehab case manager      and social worker will assess for psychosocial needs and discharge      planning.  Estimated length of stay is 7-10 days.  Goal:      Supervision.  Prognosis:  Fair.  2. Deep vein thrombosis prophylaxis:  Subcutaneous Lovenox.  3. Orthostasis:  Continue Florinef for now.  The patient is off Lasix.      Monitor renal status and overall fluid status as we go forward..  4. Diabetes, type 2:  Resume Byetta as well as Glucovance.  Stop      Lantus insulin and cover with sliding scale      insulin for now.  Hopefully we can obtain reasonable control of his      sugars.  5  Tylenol p.r.n. for headache.  1. Pulmonary:  Wean oxygen.      Ranelle Oyster, M.D.  Electronically Signed     ZTS/MEDQ  D:  08/28/2007  T:  08/28/2007  Job:  [id number]

## 2011-04-26 NOTE — Assessment & Plan Note (Signed)
Mr. Johnathan [name] is back regarding his left PCA stroke.  He was discharged home  on September 26th with his wife.  He is receiving outpatient therapy.  I  reviewed his case with Therapy last week and he is moving better but  still is impaired significantly from a language and memory standpoint.  The patient remains distracted at times and memory is poor, although his  wife does note some better problem solving and slow improvement in his  memory.  He has had a new fixation, however, on smoking.  He had not  smoked for seven or eight years and now insists that he is smoking and  wants cigarettes and seems to perseverate on this.  The patient remains  on Neurontin for a history of peripheral neuropathy.  He is on Plavix  for stroke prophylaxis.  The wife does not note any agitated or  aggressive behavior.  No physical behavior is seen.  Currently, he is  having no pain.   REVIEW OF SYSTEMS:  Notable for the above.  His sugars have been  occasionally high.  Otherwise, he has been stable from a medical  standpoint.  A full review is in the written health and history section.   SOCIAL HISTORY:  The patient is married and living with his wife.  His  wife is looking for supervision for [place] during the day.   PHYSICAL EXAMINATION:  VITAL SIGNS:  Blood pressure is 123/59, pulse is  86, respiratory rate 18, his saturation is 96% on room air.  GENERAL:  The patient is pleasant and alert and oriented x3.  PAIN AND REHAB EVALUATION:  Affect is very bright though he is very  distracted and tangential in thought processing.  Gait is notable for  some antalgia to the right and to lean to the right more so than left.  He stated the knee did not hurt him, but then when I pressed upon the  patellar region and tried to manipulate the knee he had some pain  anteriorly.  The patient's coordination was fair though he still had  some right-handed numbness.  Right facial droop is seen.  Speech was  clear.  He has  difficulty with problem solving and could not tell me the  date nor the holidays that are coming up.  He had difficulty performing  simple math.  He could identify certain objects.  He was able to find  the third room in our office without queuing today.  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  HEART:  Regular.  CHEST:  Clear.  ABDOMEN:  Soft and nontender.  It does appear that he has lost some  weight.   ASSESSMENT:  Left posterior cerebral artery infarct with right  hemisensory loss and mild paresis, which is improving.  The patient with  significant right homonomous hemianopsia.  His severe memory and  language deficits still.   PLAN:  1. Will begin him on a trial of Aricept to see if we can boost his      memory a bit.  We will begin at 5 mg q.h.s. and then increase it to      10 mg q.h.s.  2. Consider Ritalin trial.  3. Wrote reminders regarding his smoking cessation for the wife and      ordered the patient not to smoke.  Hopefully, by putting queues      around the house that are notated by the doctor, this will help him  realize that he was not smoking and should not smoke regardless.  4. Agree with decision to find help for Mckale's supervision during the      day if she plans to return to work.  5. Continue Neurontin for neuropathic pain.  6. Continue Florinef for mild orthostasis, although this is improving      and hopefully he can come off this soon.  7. Diabetes management per Dr. Andee Lineman.      Ranelle Oyster, M.D.  Electronically Signed     ZTS/MedQ  D:  10/10/2007 12:27:30  T:  10/10/2007 18:39:38  Job #:  [id number]   cc:   Learta Codding, MD,FACC  [address]  [place], [place] [address]

## 2011-04-26 NOTE — Assessment & Plan Note (Signed)
Johnathan [name] is back regarding his left PCA stroke.  Apparently, he had a weak  episode with a fall in April and was taken off Aricept and then  subsequently Lipitor after the fall.  He has gotten some strength back  but is still having some weakness, and gait is not as it once was.  Wife  feels that he is having some fear and anxiety, seems to have improved a  bit off both medications.  The patient is on niacin now as opposed to  Lipitor.  Wife does notice a decrease in memory off the Aricept.  Overall, sleep is good.  The patient does walk around the house short  distances but really has not been aggressive with activity as he does  initiate much of that type of exercise.  He does do small things around  the house.  He likes to do some cutting in the yard etc.   REVIEW OF SYSTEMS:  Notable for some weakness and weight loss.  He is  still smoking.  Full review is in the written health and history  section.  Other pertinent positives are above.   SOCIAL HISTORY:  The patient is living with his wife, who is very  supportive.   PHYSICAL EXAMINATION:  VITAL SIGNS:  Blood pressure is 127/51, pulse is  76, respiratory rate is 16, and he is saturating 98% on room air.  GENERAL:  The patient is pleasant, a bit anxious at times and can be  irritable.  He walks with a gait favoring the left hip and the right  knee.  He occasionally lost balance to the left with me today.  He lacks  insight and awareness.  Memory is poor.  We are frequently answering,  and he is asking the same questions.  His attention overall was poor.  HEART:  Regular.  CHEST:  Clear.  ABDOMEN:  Soft and nontender.  EXTREMITIES:  Strength is very good in all 4 extremities at 5/5.  Visual  fields may have improved slightly to the right, with perhaps 5 degrees  noted past midline today.   ASSESSMENT:  Left posterior cerebral artery infarct with right visual-  field loss and balance deficits; patient with recent setback and fall,  perhaps due to Lipitor and myopathy.   PLAN:  1. We will have the patient enrolled in the balance program at [facility] to work on confidence, endurance, and safety.  2. Resume Aricept at 5 mg nightly.  I really doubt that this was      causing his muscle weakness.  We will start him on a low dose, 5 mg      nightly, and if there are any symptoms noted we will discontinue      again.  I think he was benefiting from this enough to try it again.  3. Suggested prism lenses for glasses.  He is not a candidate to drive      due to his cognitive and visual issues.  4. I will see him back in 3 months.      Ranelle Oyster, M.D.  Electronically Signed     ZTS/MedQ  D:  07/21/2008 11:23:33  T:  07/22/2008 02:08:05  Job #:  [id number]   cc:   Windle Guard, M.D.  Fax: [phone]

## 2011-04-26 NOTE — Discharge Summary (Signed)
[name], Johnathan [name] NO.:  [id number]   MEDICAL RECORD NO.:  [id number]          PATIENT TYPE:  INP   LOCATION:  3029                         FACILITY:  [facility]   PHYSICIAN:  Mobolaji B. Bakare, M.D.DATE OF BIRTH:  Jan 13, 1936   DATE OF ADMISSION:  08/20/2007  DATE OF DISCHARGE:                               DISCHARGE SUMMARY   ADDENDUM:   DISCHARGE MEDICATIONS:  1. Plavix 75 mg daily.  2. Lovenox 40 mg subcutaneously daily until ambulation is      satisfactory.  3. Florinef 0.1 mg orally daily.  4. Lasix 10 mg orally daily.  5. Neurontin 300 mg at bedtime.  6. Lantus 20 units subcutaneously at bedtime.  7. NovoLog sliding scale with sensitive protocol q.a.c.  8. Xalatan 1 right eye at bedtime.  9. Zosyn 3.375 mg IV q. 6 h to complete treatment on the 17th of      September, 2008.  10.Zocor 20 mg at bedtime.  11.Protonix 40 mg daily.  12.Tylenol 650 mg orally q. 4 h p.r.n.   ADDENDUM:   HOSPITAL COURSE:  The patient was started on Florinef 0.1 mg daily on  08/25/07. He was noted to have orthostatic hypotension. Will recommend  orthostatic blood pressure checking 3 times a week and careful  monitoring of the fluid status in view of history of diastolic heart  failure.   CONDITION ON DISCHARGE:  Stable. The patient is ready for inpatient  rehabilitation.   DISCHARGE LEVEL LABORATORY DATA:  White cells 5.1, hemoglobin 11.3,  platelets 142. BUN 21, creatinine 1.52. sodium 145, potassium 4.1,  chloride 103, bicarb 33, calcium 9.2.      Mobolaji B. Corky Downs, M.D.  Electronically Signed     MBB/MEDQ  D:  08/28/2007  T:  08/28/2007  Job:  [id number]   cc:   Rehabilitation Unit

## 2011-04-29 NOTE — Op Note (Signed)
[name], Johnathan [name]                            ACCOUNT NO.:  [id number]   MEDICAL RECORD NO.:  [id number]                   PATIENT TYPE:  INP   LOCATION:  5027                                 FACILITY:  [facility]   PHYSICIAN:  Mila Homer. Sherlean Foot, M.D.              DATE OF BIRTH:  [birth year]   DATE OF PROCEDURE:  01/14/2004  DATE OF DISCHARGE:  01/20/2004                                 OPERATIVE REPORT   PREOPERATIVE DIAGNOSIS:  Right hip intertrochanteric fracture.   POSTOPERATIVE DIAGNOSIS:  Right hip intertrochanteric fracture.   PROCEDURE:  Right hip open reduction internal fixation.   ANESTHESIA:  General.   SURGEON:  Mila Homer. Sherlean Foot, M.D.   ASSISTANT:  Legrand Pitts. Duffy, P.A.   INDICATIONS FOR PROCEDURE:  The patient is a 75 year old with an  intertrochanteric fracture.  After preoperative medical clearance was  obtained, he was taken to the operating room.   DESCRIPTION OF PROCEDURE:  The patient was laid supine, administered general  anesthesia.  The left hip was placed in the lithotomy position, the right  hip in traction.  The right hip was prepped and draped in the usual sterile  fashion.  A reduction was obtained non sterilely with AP and lateral C-arm  imaging and traction and internal rotation.  I then prepped with hip and  draped in the usual sterile fashion.  I performed a direct lateral approach  to the proximal shaft of the femur with a #10 blade through a 4 inch  incision.  I elevated the vastus lateralis anteriorly, held it in place with  a Ship broker.  I cleaned off the lateral cortex with a Cobb elevator.  Then using the 135 mm guide, I placed a 2.0 mm guide wire in the center-  center position up into the femoral head, verified this on the AP and  lateral C-arm imaging.  I then put a, I measured this at a 90 mm.  I reamed  to an 85, placed a 90 mm screw and then slid a 135 degree barrel plate over  that.  I then fastened it to the femur with four  bicortical screws and  verified this on AP and lateral C-arm imaging.  I then irrigated, closed  with a running #1 Vicryl in the fascia lata, an interrupted 0 in the deep  soft tissue and a running 2-0 in the subcuticular layer.  I then placed skin  staples.  I dressed with Xeroform dressing and sponges and Ioban drape.  The  patient tolerated the procedure well.   COMPLICATIONS:  None.   DRAINS:  None.   ESTIMATED BLOOD LOSS:  300 cc.  Mila Homer. Sherlean Foot, M.D.    SDL/MEDQ  D:  02/16/2004  T:  02/16/2004  Job:  [id number]

## 2011-04-29 NOTE — Discharge Summary (Signed)
Johnathan [name], Johnathan [name]                            ACCOUNT NO.:  [id number]   MEDICAL RECORD NO.:  [id number]                   PATIENT TYPE:  INP   LOCATION:  5027                                 FACILITY:  [facility]   PHYSICIAN:  Mila Homer. Sherlean Foot, M.D.              DATE OF BIRTH:  03-06-1936   DATE OF ADMISSION:  01/12/2004  DATE OF DISCHARGE:  01/20/2004                                 DISCHARGE SUMMARY   ADMISSION DIAGNOSES:  1. Right hip intertrochanteric fracture.  2. Right humerus fracture, nondisplaced.  3. History of cerebrovascular accident x 2.  4. Noninsulin-dependent diabetes mellitus.  5. Anemia.  6. Hypercholesterolemia.  7. Peripheral edema of left lower leg.  8. Spinal arterial thrombosis.  9. 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   DISCHARGE DIAGNOSES:  1. Status post right hip open reduction and internal fixation.  2. Right proximal humerus fracture, nondisplaced.  3. Uncontrolled diabetes mellitus.  4. Transient acute renal insufficiency.  5. Acute blood loss anemia secondary to surgery, stable.  6. Hypertension, stable.  7. History of cerebrovascular accident x 2.  8. Hypercholesterolemia.  9. History of anemia.  10.      Spinal arterial thrombosis.  11.      Partial paralysis of left arm due to a birth injury.  12.      Peripheral edema of left lower leg.   HISTORY OF PRESENT ILLNESS:  Mr. Waddington is a 75 year old white male who fell  on some ice around 1930 hours on January 12, 2004.  The patient denies any  loss of consciousness.  He primarily presented with right knee and right  elbow pain.  The patient was evaluated in the [facility] Emergency Room and found  to have a right intertrochanteric fracture and a right humerus head  nondisplaced fracture.   ALLERGIES:  No known drug allergies.   MEDICATIONS:  1. Plavix 75 mg one daily.  2. Neurontin 800 mg daily (300 mg with breakfast, 200 mg with lunch, and 300     mg with supper).  3. Avandia 4 mg one  daily.  4. Glucovance 5/500 mg two tablets b.i.d.  5. Lipitor 10 mg one daily.  6. Enteric-coated aspirin one p.o. daily.  7. Feosol 65 mg one daily.  8. Altace 1.25 mg one daily.   SURGICAL PROCEDURE:  The patient was taken to the operating room on January 14, 2004, by Mila Homer. Sherlean Foot, M.D., assisted by Legrand Pitts. Duffy, P.A.  The  patient was placed under general anesthesia and a right hip ORIF was  performed.  The estimated blood loss was 200 mL.  No complications.  The  patient returned to recovery in stable condition.   CONSULTS:  The following consults were obtained while the patient was  hospitalized.  1. Physical therapy.  2. Occupational therapy.  3. Rehabilitation medicine.  4. Case management.   HOSPITAL  COURSE:  #1 - FEVER OF UNKNOWN ORIGIN:  The patient developed fever  on postoperative day #1 and had a transient fever for the next five days.  Blood cultures were obtained and were negative.  The patient did have left  greater than right basilar atelectasis and positive peribronchial  thickening.  The patient received antibiotics and also breathing treatments  during the hospital stay.  On hospital day #6, the patient had been afebrile  x 24 hours.   #2 - DIABETES MELLITUS, UNCONTROLLED:  The patient is to resume Avandia and  Glucophage.   #3 - TRANSIENT ACUTE RENAL INSUFFICIENCY:  Resolved.   #4 - HYPERTENSION:  Hypertension throughout the hospital stay was stable.   #5 - ACUTE BLOOD LOSS ANEMIA:  Required the transfusion of two units of  packed red blood cells.   #6 - CALF PAIN BILATERALLY:  This was worked up.  A Doppler done on January 15, 2004, showed no obvious evidence of DVT, superficial thrombosis, or  Baker's cysts.   #7 - THROMBOCYTOPENIA:  Resolved.   LABORATORIES:  Routine laboratories on admission included CBC with white  blood cell count 9.4, hemoglobin 10.3, hematocrit 31.4, and platelets 132.  At the time of discharge, the hemoglobin was 9.4  and hematocrit 28.5.  Coagulation times on admission included PT 13.6, INR 1.1, and PTT 37.  Routine chemistries on admission with sodium 136, potassium 4.9, chloride  108, bicarbonate 23, glucose 262, BUN 28, and creatinine 1.2.  The  urinalysis on admission was negative.  The glucose was 500.  Blood culture x  2 negative.   X-RAYS:  Chest x-ray on January 12, 2004, showed mild peribronchial  thickening present, the remainder of the lungs clear, and diffuse  osteopenia.  The pelvis showed a right intertrochanteric hip fracture.  Right knee with no acute abnormality.  Right hip with right  intertrochanteric fracture.  The right humerus on January 12, 2004, showed a  right humeral head and neck fracture with no evidence of dislocation.  The  right hip postoperatively dated January 14, 2004, showed good postoperative  open reduction and internal fixation of intertrochanteric fracture of the  right hip.  A portable chest x-ray dated January 16, 2004, showed low lung  volumes with bibasilar atelectasis and vascular congestion.  The chest x-ray  dated January 18, 2004, showed increasing lung volumes with decreasing right  basilar opacity, vascular congestion, and persistent left lower lobe  opacity, atelectasis versus infiltrate.  Chest x-ray dated January 19, 2004,  showed no significant interval change.  There remained some basilar  hypoaeration.   DISCHARGE MEDICATIONS:  1. Percocet one to two tablets q.4-6h. p.r.n. pain.  2. Glucophage 500 mg p.o. b.i.d.  3. Avandia 4 mg p.o. daily.  4. Neurontin 300 mg in a.m., 200 mg at lunch, and 300 mg in p.m.  5. Ferrous sulfate 325 mg p.o. daily.  6. Altace 1.25 mg p.o. daily.  7. Zocor 20 mg p.o. daily.  8. Sliding scale for insulin.  9. Lovenox 40 mg injection daily.  Last dose to be on January 28, 2004.  10.      MiraLax 17 g p.o. daily.  11.      Glucotrol 10 mg p.o. daily a.c. 12.      Glucotrol 5 mg p.o. __________  13.      Lasix 40  mg p.o. daily.  14.      Protonix 40 mg p.o. daily.  15.  Lantus 38 units subcutaneously daily.  16.      Albuterol 2.5 mg/3 ml nebulizer solution q.4h.  17.      Tylenol 325 mg p.o. q.4-3h. p.r.n. temperature greater than 101.5     degrees.   ACTIVITY:  The patient's weightbearing is as tolerated on the right leg with  walker.  Right arm in sling at all times, except for gentle range of motion  of the elbow and wrist.   DIET:  ADA 2000.   DISPOSITION:  The patient was discharged in stable condition to the skilled  nursing facility.  The patient was afebrile and vital signs were stable.   FOLLOWUP:  The patient needs followup with Dr. Sherlean Foot in two weeks from  discharge.  The patient or the patient's wife is to call the office at [phone] to make an appointment.   WOUND CARE:  The patient is to keep the wound of the right clean and dry.  Dressing changes daily.   SPECIAL INSTRUCTIONS:  The office is to be notified if the patient has a  temperature greater than 101.5 degrees, swelling, foul-smelling drainage,  chills, or if pain is not controlled with pain medication.  The patient will  need to have staples removed from the right hip in approximately eight days.  Steri-Strips will then need to be applied.  The patient will need daily CBGs  and diabetes medications will need to be adjusted based on these CBGs.  If  the patient develops a fever, this may need to be worked up again.  However,  blood cultures and UA were negative during the hospital stay.      Richardean Canal, P.A.                       Mila Homer. Sherlean Foot, M.D.    GC/MEDQ  D:  01/20/2004  T:  01/20/2004  Job:  [id number]   cc:   Windle Guard, M.D.  [address]  [place], [place] [address]  Fax: [phone]

## 2011-09-22 LAB — BASIC METABOLIC PANEL
BUN: 15
BUN: 21
BUN: 23
CO2: 24
CO2: 26
Calcium: 8.5
Chloride: 106
Chloride: 107
Creatinine, Ser: 1.52 — ABNORMAL HIGH
Creatinine, Ser: 1.56 — ABNORMAL HIGH
Creatinine, Ser: 2.22 — ABNORMAL HIGH
GFR calc Af Amer: 54 — ABNORMAL LOW
GFR calc non Af Amer: 46 — ABNORMAL LOW
Glucose, Bld: 174 — ABNORMAL HIGH

## 2011-09-22 LAB — COMPREHENSIVE METABOLIC PANEL
AST: 21
Albumin: 2.7 — ABNORMAL LOW
Chloride: 104
Creatinine, Ser: 1.55 — ABNORMAL HIGH
GFR calc Af Amer: 54 — ABNORMAL LOW
Potassium: 4.3
Total Bilirubin: 0.7
Total Protein: 6.4

## 2011-09-22 LAB — CBC
HCT: 33.5 — ABNORMAL LOW
MCV: 92.6
MCV: 92.6
Platelets: 142 — ABNORMAL LOW
Platelets: 159
RDW: 14.4 — ABNORMAL HIGH
WBC: 5.9

## 2011-09-22 LAB — DIFFERENTIAL
Basophils Absolute: 0
Eosinophils Relative: 15 — ABNORMAL HIGH
Lymphocytes Relative: 17
Lymphs Abs: 1
Monocytes Absolute: 0.6
Monocytes Relative: 10

## 2011-09-23 LAB — HEMOGLOBIN A1C
Hgb A1c MFr Bld: 7.2 — ABNORMAL HIGH
Mean Plasma Glucose: 179

## 2011-09-23 LAB — PROTEIN C ACTIVITY: Protein C Activity: 118 % (ref 75–133)

## 2011-09-23 LAB — COMPREHENSIVE METABOLIC PANEL
ALT: 11
ALT: 13
AST: 17
AST: 21
Albumin: 3 — ABNORMAL LOW
Albumin: 3.3 — ABNORMAL LOW
CO2: 25
Calcium: 8.6
Calcium: 9.1
Creatinine, Ser: 1.24
Creatinine, Ser: 1.69 — ABNORMAL HIGH
GFR calc Af Amer: 49 — ABNORMAL LOW
GFR calc Af Amer: >60
GFR calc non Af Amer: 58 — ABNORMAL LOW
Sodium: 139
Sodium: 140
Total Protein: 6.4

## 2011-09-23 LAB — CBC
HCT: 32.7 — ABNORMAL LOW
HCT: 32.8 — ABNORMAL LOW
Hemoglobin: 10 — ABNORMAL LOW
Hemoglobin: 10.8 — ABNORMAL LOW
Hemoglobin: 11 — ABNORMAL LOW
MCHC: 33.3
MCHC: 33.3
MCHC: 33.4
MCHC: 33.5
MCHC: 33.6
MCV: 92.5
MCV: 92.6
MCV: 92.9
MCV: 92.9
MCV: 93.7
Platelets: 127 — ABNORMAL LOW
Platelets: 144 — ABNORMAL LOW
Platelets: 150
RBC: 3.2 — ABNORMAL LOW
RBC: 3.5 — ABNORMAL LOW
RBC: 3.56 — ABNORMAL LOW
RBC: 3.84 — ABNORMAL LOW
RDW: 14
RDW: 14
WBC: 6.1
WBC: 7.7

## 2011-09-23 LAB — BASIC METABOLIC PANEL
BUN: 13
BUN: 26 — ABNORMAL HIGH
CO2: 25
CO2: 26
CO2: 26
CO2: 30
CO2: 33 — ABNORMAL HIGH
Calcium: 8.6
Calcium: 9
Chloride: 100
Chloride: 100
Chloride: 101
Chloride: 108
Creatinine, Ser: 1.35
Creatinine, Ser: 1.46
GFR calc Af Amer: 58 — ABNORMAL LOW
GFR calc Af Amer: >60
GFR calc Af Amer: >60
GFR calc non Af Amer: 53 — ABNORMAL LOW
Glucose, Bld: 116 — ABNORMAL HIGH
Glucose, Bld: 158 — ABNORMAL HIGH
Glucose, Bld: 233 — ABNORMAL HIGH
Potassium: 3.4 — ABNORMAL LOW
Potassium: 4
Potassium: 4.5
Sodium: 141
Sodium: 141

## 2011-09-23 LAB — LUPUS ANTICOAGULANT PANEL
DRVVT: 35.6 — ABNORMAL LOW (ref 36.1–47.0)
PTT Lupus Anticoagulant: 36.8 (ref 36.3–48.8)

## 2011-09-23 LAB — URINE CULTURE

## 2011-09-23 LAB — BLOOD GAS, ARTERIAL
Acid-Base Excess: 2.4 — ABNORMAL HIGH
Drawn by: 26627
O2 Content: 3
O2 Saturation: 94.1
TCO2: 26.8
pCO2 arterial: 36.4

## 2011-09-23 LAB — URINE MICROSCOPIC-ADD ON

## 2011-09-23 LAB — CARDIAC PANEL(CRET KIN+CKTOT+MB+TROPI)
Relative Index: INVALID
Relative Index: INVALID
Total CK: 88
Troponin I: 0.01

## 2011-09-23 LAB — CULTURE, BLOOD (ROUTINE X 2)
Culture: NO GROWTH
Culture: NO GROWTH

## 2011-09-23 LAB — CK TOTAL AND CKMB (NOT AT ARMC)
Relative Index: INVALID
Total CK: 82

## 2011-09-23 LAB — URINALYSIS, ROUTINE W REFLEX MICROSCOPIC
Bilirubin Urine: NEGATIVE
Ketones, ur: 15 — AB
Nitrite: NEGATIVE
pH: 5.5

## 2011-09-23 LAB — HOMOCYSTEINE: Homocysteine: 11.9

## 2011-09-23 LAB — DIFFERENTIAL
Eosinophils Absolute: 0.2
Eosinophils Relative: 5
Lymphocytes Relative: 22
Lymphs Abs: 1.2
Monocytes Absolute: 0.4
Monocytes Relative: 8

## 2011-09-23 LAB — BETA-2-GLYCOPROTEIN I ABS, IGG/M/A
Beta-2 Glyco I IgG: <4 U/mL (ref <20)
Beta-2-Glycoprotein I IgM: <4 U/mL (ref <10)

## 2011-09-23 LAB — TSH: TSH: 4.312

## 2011-09-23 LAB — SEDIMENTATION RATE: Sed Rate: 25 — ABNORMAL HIGH

## 2011-09-23 LAB — APTT: aPTT: 28

## 2011-09-23 LAB — CARDIOLIPIN ANTIBODIES, IGG, IGM, IGA: Anticardiolipin IgG: <7 — ABNORMAL LOW (ref <11)

## 2011-09-23 LAB — PROTHROMBIN GENE MUTATION

## 2014-05-13 ENCOUNTER — Other Ambulatory Visit (HOSPITAL_COMMUNITY): Payer: Self-pay | Admitting: Family Medicine

## 2014-05-13 DIAGNOSIS — M7989 Other specified soft tissue disorders: Secondary | ICD-10-CM

## 2014-05-14 ENCOUNTER — Ambulatory Visit (HOSPITAL_COMMUNITY)
Admission: RE | Admit: 2014-05-14 | Discharge: 2014-05-14 | Disposition: A | Discharge status: Home / Self Care | Payer: Medicare Other | Source: Ambulatory Visit | Attending: Family Medicine | Admitting: Family Medicine

## 2014-05-14 DIAGNOSIS — M79609 Pain in unspecified limb: Secondary | ICD-10-CM | POA: Insufficient documentation

## 2014-05-14 DIAGNOSIS — M7989 Other specified soft tissue disorders: Secondary | ICD-10-CM | POA: Insufficient documentation

## 2014-05-14 NOTE — Progress Notes (Signed)
Left lower extremity venous duplex completed.  Left:  No evidence of DVT, superficial thrombosis, or Baker's cyst.  A fibrous strand noted in the left femoral vein.  Right:  Negative for DVT in the common femoral vein.

## 2014-12-13 ENCOUNTER — Emergency Department (HOSPITAL_BASED_OUTPATIENT_CLINIC_OR_DEPARTMENT_OTHER): Payer: Medicare Other

## 2014-12-13 ENCOUNTER — Encounter (HOSPITAL_BASED_OUTPATIENT_CLINIC_OR_DEPARTMENT_OTHER): Payer: Self-pay | Admitting: *Deleted

## 2014-12-13 ENCOUNTER — Inpatient Hospital Stay (HOSPITAL_BASED_OUTPATIENT_CLINIC_OR_DEPARTMENT_OTHER)
Admission: EM | Admit: 2014-12-13 | Discharge: 2014-12-18 | DRG: 535 | Disposition: A | Discharge status: Skilled Nursing Facility | Payer: Medicare Other | Attending: Internal Medicine | Admitting: Internal Medicine

## 2014-12-13 ENCOUNTER — Inpatient Hospital Stay (HOSPITAL_BASED_OUTPATIENT_CLINIC_OR_DEPARTMENT_OTHER): Payer: Medicare Other

## 2014-12-13 DIAGNOSIS — J189 Pneumonia, unspecified organism: Secondary | ICD-10-CM

## 2014-12-13 DIAGNOSIS — N183 Chronic kidney disease, stage 3 (moderate): Secondary | ICD-10-CM | POA: Diagnosis present

## 2014-12-13 DIAGNOSIS — E118 Type 2 diabetes mellitus with unspecified complications: Secondary | ICD-10-CM

## 2014-12-13 DIAGNOSIS — R1032 Left lower quadrant pain: Secondary | ICD-10-CM

## 2014-12-13 DIAGNOSIS — W1839XA Other fall on same level, initial encounter: Secondary | ICD-10-CM | POA: Diagnosis present

## 2014-12-13 DIAGNOSIS — J9621 Acute and chronic respiratory failure with hypoxia: Secondary | ICD-10-CM | POA: Diagnosis present

## 2014-12-13 DIAGNOSIS — E559 Vitamin D deficiency, unspecified: Secondary | ICD-10-CM

## 2014-12-13 DIAGNOSIS — I639 Cerebral infarction, unspecified: Secondary | ICD-10-CM

## 2014-12-13 DIAGNOSIS — E1122 Type 2 diabetes mellitus with diabetic chronic kidney disease: Secondary | ICD-10-CM | POA: Diagnosis present

## 2014-12-13 DIAGNOSIS — Z8673 Personal history of transient ischemic attack (TIA), and cerebral infarction without residual deficits: Secondary | ICD-10-CM | POA: Diagnosis not present

## 2014-12-13 DIAGNOSIS — S32401S Unspecified fracture of right acetabulum, sequela: Secondary | ICD-10-CM

## 2014-12-13 DIAGNOSIS — F0390 Unspecified dementia without behavioral disturbance: Secondary | ICD-10-CM | POA: Diagnosis present

## 2014-12-13 DIAGNOSIS — F17200 Nicotine dependence, unspecified, uncomplicated: Secondary | ICD-10-CM

## 2014-12-13 DIAGNOSIS — I739 Peripheral vascular disease, unspecified: Secondary | ICD-10-CM | POA: Diagnosis present

## 2014-12-13 DIAGNOSIS — Z7901 Long term (current) use of anticoagulants: Secondary | ICD-10-CM

## 2014-12-13 DIAGNOSIS — S52501A Unspecified fracture of the lower end of right radius, initial encounter for closed fracture: Secondary | ICD-10-CM | POA: Diagnosis present

## 2014-12-13 DIAGNOSIS — E119 Type 2 diabetes mellitus without complications: Secondary | ICD-10-CM | POA: Diagnosis present

## 2014-12-13 DIAGNOSIS — M21332 Wrist drop, left wrist: Secondary | ICD-10-CM | POA: Diagnosis present

## 2014-12-13 DIAGNOSIS — J449 Chronic obstructive pulmonary disease, unspecified: Secondary | ICD-10-CM | POA: Diagnosis present

## 2014-12-13 DIAGNOSIS — D649 Anemia, unspecified: Secondary | ICD-10-CM | POA: Diagnosis present

## 2014-12-13 DIAGNOSIS — F419 Anxiety disorder, unspecified: Secondary | ICD-10-CM | POA: Diagnosis present

## 2014-12-13 DIAGNOSIS — W19XXXA Unspecified fall, initial encounter: Secondary | ICD-10-CM

## 2014-12-13 DIAGNOSIS — D696 Thrombocytopenia, unspecified: Secondary | ICD-10-CM | POA: Diagnosis present

## 2014-12-13 DIAGNOSIS — E875 Hyperkalemia: Secondary | ICD-10-CM | POA: Diagnosis present

## 2014-12-13 DIAGNOSIS — F17210 Nicotine dependence, cigarettes, uncomplicated: Secondary | ICD-10-CM | POA: Diagnosis present

## 2014-12-13 DIAGNOSIS — S32401A Unspecified fracture of right acetabulum, initial encounter for closed fracture: Secondary | ICD-10-CM | POA: Diagnosis present

## 2014-12-13 DIAGNOSIS — F0150 Vascular dementia without behavioral disturbance: Secondary | ICD-10-CM | POA: Diagnosis present

## 2014-12-13 DIAGNOSIS — S7291XA Unspecified fracture of right femur, initial encounter for closed fracture: Secondary | ICD-10-CM | POA: Diagnosis present

## 2014-12-13 DIAGNOSIS — S32409A Unspecified fracture of unspecified acetabulum, initial encounter for closed fracture: Secondary | ICD-10-CM | POA: Diagnosis present

## 2014-12-13 DIAGNOSIS — Z720 Tobacco use: Secondary | ICD-10-CM

## 2014-12-13 DIAGNOSIS — S5291XA Unspecified fracture of right forearm, initial encounter for closed fracture: Secondary | ICD-10-CM

## 2014-12-13 DIAGNOSIS — R0602 Shortness of breath: Secondary | ICD-10-CM

## 2014-12-13 HISTORY — DX: Chronic obstructive pulmonary disease, unspecified: J44.9

## 2014-12-13 HISTORY — DX: Cerebral infarction, unspecified: I63.9

## 2014-12-13 HISTORY — DX: Type 2 diabetes mellitus without complications: E11.9

## 2014-12-13 LAB — CBC WITH DIFFERENTIAL/PLATELET
Basophils Absolute: 0 10*3/uL (ref 0.0–0.1)
Basophils Relative: 0 % (ref 0–1)
Eosinophils Absolute: 0.1 10*3/uL (ref 0.0–0.7)
Eosinophils Relative: 2 % (ref 0–5)
HCT: 36.9 % — ABNORMAL LOW (ref 39.0–52.0)
HEMOGLOBIN: 12.2 g/dL — AB (ref 13.0–17.0)
LYMPHS ABS: 0.8 10*3/uL (ref 0.7–4.0)
Lymphocytes Relative: 10 % — ABNORMAL LOW (ref 12–46)
MCH: 33.4 pg (ref 26.0–34.0)
MCHC: 33.1 g/dL (ref 30.0–36.0)
MCV: 101.1 fL — ABNORMAL HIGH (ref 78.0–100.0)
Monocytes Absolute: 0.4 10*3/uL (ref 0.1–1.0)
Monocytes Relative: 5 % (ref 3–12)
Neutro Abs: 6.8 10*3/uL (ref 1.7–7.7)
Neutrophils Relative %: 83 % — ABNORMAL HIGH (ref 43–77)
Platelets: 95 10*3/uL — ABNORMAL LOW (ref 150–400)
RBC: 3.65 MIL/uL — ABNORMAL LOW (ref 4.22–5.81)
RDW: 14.6 % (ref 11.5–15.5)
WBC: 8.1 10*3/uL (ref 4.0–10.5)

## 2014-12-13 LAB — COMPREHENSIVE METABOLIC PANEL
ALBUMIN: 3.3 g/dL — AB (ref 3.5–5.2)
ALT: 11 U/L (ref 0–53)
AST: 18 U/L (ref 0–37)
Alkaline Phosphatase: 69 U/L (ref 39–117)
Anion gap: 5 (ref 5–15)
BILIRUBIN TOTAL: 0.5 mg/dL (ref 0.3–1.2)
BUN: 20 mg/dL (ref 6–23)
CALCIUM: 8.6 mg/dL (ref 8.4–10.5)
CHLORIDE: 109 meq/L (ref 96–112)
CO2: 26 mmol/L (ref 19–32)
Creatinine, Ser: 1.7 mg/dL — ABNORMAL HIGH (ref 0.50–1.35)
GFR, EST AFRICAN AMERICAN: 43 mL/min — AB (ref >90)
GFR, EST NON AFRICAN AMERICAN: 37 mL/min — AB (ref >90)
Glucose, Bld: 189 mg/dL — ABNORMAL HIGH (ref 70–99)
Potassium: 5.2 mmol/L — ABNORMAL HIGH (ref 3.5–5.1)
Sodium: 140 mmol/L (ref 135–145)
TOTAL PROTEIN: 6.5 g/dL (ref 6.0–8.3)

## 2014-12-13 LAB — GLUCOSE, CAPILLARY: GLUCOSE-CAPILLARY: 202 mg/dL — AB (ref 70–99)

## 2014-12-13 MED ORDER — HYDROMORPHONE HCL 1 MG/ML IJ SOLN
0.5000 mg | Freq: Once | INTRAMUSCULAR | Status: AC
  Administered 2014-12-13: 0.5 mg via INTRAVENOUS
  Filled 2014-12-13: qty 1

## 2014-12-13 MED ORDER — DONEPEZIL HCL 10 MG PO TBDP: 10.0000 mg | ORAL_TABLET | Freq: Every day | ORAL | Status: DC

## 2014-12-13 MED ORDER — MORPHINE SULFATE 2 MG/ML IJ SOLN
2.0000 mg | INTRAMUSCULAR | Status: DC | PRN
  Administered 2014-12-13 – 2014-12-16 (×5): 2 mg via INTRAVENOUS
  Filled 2014-12-13 (×5): qty 1

## 2014-12-13 MED ORDER — MOMETASONE FURO-FORMOTEROL FUM 100-5 MCG/ACT IN AERO
2.0000 | INHALATION_SPRAY | Freq: Two times a day (BID) | RESPIRATORY_TRACT | Status: DC
  Administered 2014-12-14 – 2014-12-18 (×9): 2 via RESPIRATORY_TRACT
  Filled 2014-12-13: qty 8.8

## 2014-12-13 MED ORDER — IPRATROPIUM-ALBUTEROL 0.5-2.5 (3) MG/3ML IN SOLN: 3.0000 mL | Freq: Four times a day (QID) | RESPIRATORY_TRACT | Status: DC | PRN

## 2014-12-13 MED ORDER — DONEPEZIL HCL 10 MG PO TABS
10.0000 mg | ORAL_TABLET | Freq: Every day | ORAL | Status: DC
  Administered 2014-12-13 – 2014-12-17 (×5): 10 mg via ORAL
  Filled 2014-12-13 (×6): qty 1

## 2014-12-13 MED ORDER — ONDANSETRON HCL 4 MG/2ML IJ SOLN
4.0000 mg | Freq: Once | INTRAMUSCULAR | Status: AC
  Administered 2014-12-13: 4 mg via INTRAVENOUS
  Filled 2014-12-13: qty 2

## 2014-12-13 MED ORDER — METHOCARBAMOL 500 MG PO TABS
500.0000 mg | ORAL_TABLET | Freq: Four times a day (QID) | ORAL | Status: DC | PRN
  Administered 2014-12-14 – 2014-12-15 (×2): 500 mg via ORAL
  Filled 2014-12-13 (×5): qty 1

## 2014-12-13 MED ORDER — INSULIN ASPART 100 UNIT/ML ~~LOC~~ SOLN
0.0000 [IU] | Freq: Every day | SUBCUTANEOUS | Status: DC
  Administered 2014-12-13 – 2014-12-16 (×2): 2 [IU] via SUBCUTANEOUS

## 2014-12-13 MED ORDER — TIOTROPIUM BROMIDE MONOHYDRATE 18 MCG IN CAPS
18.0000 ug | ORAL_CAPSULE | Freq: Every day | RESPIRATORY_TRACT | Status: DC
  Administered 2014-12-14 – 2014-12-18 (×5): 18 ug via RESPIRATORY_TRACT
  Filled 2014-12-13: qty 5

## 2014-12-13 MED ORDER — OXYCODONE HCL 5 MG PO TABS
5.0000 mg | ORAL_TABLET | ORAL | Status: DC | PRN
  Administered 2014-12-14 (×2): 10 mg via ORAL
  Filled 2014-12-13 (×2): qty 2

## 2014-12-13 MED ORDER — INSULIN ASPART 100 UNIT/ML ~~LOC~~ SOLN
0.0000 [IU] | Freq: Three times a day (TID) | SUBCUTANEOUS | Status: DC
  Administered 2014-12-14: 3 [IU] via SUBCUTANEOUS
  Administered 2014-12-14: 2 [IU] via SUBCUTANEOUS
  Administered 2014-12-15: 3 [IU] via SUBCUTANEOUS
  Administered 2014-12-15: 2 [IU] via SUBCUTANEOUS
  Administered 2014-12-16: 3 [IU] via SUBCUTANEOUS
  Administered 2014-12-16: 2 [IU] via SUBCUTANEOUS
  Administered 2014-12-16 – 2014-12-17 (×4): 3 [IU] via SUBCUTANEOUS
  Administered 2014-12-18: 2 [IU] via SUBCUTANEOUS
  Administered 2014-12-18: 3 [IU] via SUBCUTANEOUS

## 2014-12-13 MED ORDER — SODIUM CHLORIDE 0.9 % IV SOLN
Freq: Once | INTRAVENOUS | Status: AC
  Administered 2014-12-13: 50 mL/h via INTRAVENOUS

## 2014-12-13 MED ORDER — SODIUM CHLORIDE 0.9 % IV SOLN
INTRAVENOUS | Status: DC
  Administered 2014-12-13: 23:00:00 via INTRAVENOUS
  Administered 2014-12-14 – 2014-12-15 (×2): 1000 mL via INTRAVENOUS

## 2014-12-13 MED ORDER — GABAPENTIN 300 MG PO CAPS
300.0000 mg | ORAL_CAPSULE | Freq: Two times a day (BID) | ORAL | Status: DC
  Administered 2014-12-13 – 2014-12-18 (×10): 300 mg via ORAL
  Filled 2014-12-13 (×11): qty 1

## 2014-12-13 MED ORDER — METHOCARBAMOL 1000 MG/10ML IJ SOLN: 500.0000 mg | Freq: Four times a day (QID) | INTRAVENOUS | Status: DC | PRN

## 2014-12-13 NOTE — H&P (Signed)
[facility] History and Physical  Patient: Johnathan [name]  WJX:[id number]  DOB: 1935/12/20  DOS: the patient was seen and examined on 12/13/2014 PCP: Kaleen Mask, MD  Chief Complaint: Fall  HPI: Johnathan [name] is a 79 y.o. male with Past medical history of diabetes mellitus, CVA, suspected COPD, history of right femur fracture 2005, history of left wrist drop since childhood dementia. Patient presents with complaints of a fall. He mentions that he was playing with his dog and he placed his foot on dog's bone and lost his balance and fell down. He denied any injury to head or neck. The fall was not witnessed but after the fall after hearing the thudd wife came to the  room and found the patient sitting on the floor with complaining of pain in his right hand as well as right hip. With this the patient was taken to [facility] and from that he was transferred here for right acetabular fracture. At the time of my evaluation patient denied any complaint of prior shortness of breath or chest pain patient denies any complaint of current shortness of breath or chest pain. He denies any cough fever or chills denies any nausea vomiting denies any choking episode. He denies any leg swelling. Denies any dizziness or lightheadedness. His only complaint is pain in his right hip. He does not have any significant pain in his right hand. He is able to move his right fingers and feel them. Patient had a right intertrochanteric femur fracture 2005 operated by Dr. Georgena Spurling,  The patient is coming from home. And at his baseline independent for most of his ADL.  Review of Systems: as mentioned in the history of present illness.  A Comprehensive review of the other systems is negative.  Past Medical History  Diagnosis Date  . Diabetes mellitus without complication   . Stroke   . COPD (chronic obstructive pulmonary disease)    No past surgical history on file. Social History:  reports  that he has been smoking Cigarettes.  He has been smoking about 2.00 packs per day. He does not have any smokeless tobacco history on file. He reports that he does not drink alcohol. His drug history is not on file.  Allergies  Allergen Reactions  . Solarcaine [Benzocaine]     No family history on file.  Prior to Admission medications   Medication Sig Start Date End Date Taking? Authorizing Provider  clopidogrel (PLAVIX) 75 MG tablet Take 75 mg by mouth daily.   Yes Historical Provider, MD  donepezil (ARICEPT ODT) 10 MG disintegrating tablet Take 10 mg by mouth at bedtime.   Yes Historical Provider, MD  gabapentin (NEURONTIN) 300 MG capsule Take 300 mg by mouth 2 (two) times daily.    Yes Historical Provider, MD  glimepiride (AMARYL) 4 MG tablet Take 4 mg by mouth daily with breakfast.   Yes Historical Provider, MD  pioglitazone (ACTOS) 45 MG tablet Take 45 mg by mouth daily.   Yes Historical Provider, MD  sitaGLIPtin (JANUVIA) 100 MG tablet Take 100 mg by mouth daily.   Yes Historical Provider, MD    Physical Exam: Filed Vitals:   12/13/14 2030 12/13/14 2045 12/13/14 2212 12/13/14 2345  BP: 147/89 146/63 142/46   Pulse: 100 94 86   Temp:   98.3 F (36.8 C)   TempSrc:   Oral   Resp:   18   Height:    5' 10.8" (1.798 m)  Weight:  72.167 kg (159 lb 1.6 oz)  SpO2: 96% 97% 98%     General: Alert, Awake and Oriented to Time, Place and Person. Appear in mild distress Eyes: PERRL ENT: Oral Mucosa clear moist Neck: no JVD Cardiovascular: S1 and S2 Present, no Murmur, Peripheral Pulses Present Respiratory: Bilateral Air entry equal and Decreased, Clear to Auscultation, noCrackles, no wheezes Abdomen: Bowel Sound present, Soft and non tender Skin: no Rash Extremities: no Pedal edema, no calf tenderness Neurologic: Grossly no focal neuro deficit.  Labs on Admission:  CBC:  Recent Labs Lab 12/13/14 1640  WBC 8.1  NEUTROABS 6.8  HGB 12.2*  HCT 36.9*  MCV 101.1*  PLT 95*     CMP     Component Value Date/Time   NA 140 12/13/2014 1640   K 5.2* 12/13/2014 1640   CL 109 12/13/2014 1640   CO2 26 12/13/2014 1640   GLUCOSE 189* 12/13/2014 1640   BUN 20 12/13/2014 1640   CREATININE 1.70* 12/13/2014 1640   CALCIUM 8.6 12/13/2014 1640   PROT 6.5 12/13/2014 1640   ALBUMIN 3.3* 12/13/2014 1640   AST 18 12/13/2014 1640   ALT 11 12/13/2014 1640   ALKPHOS 69 12/13/2014 1640   BILITOT 0.5 12/13/2014 1640   GFRNONAA 37* 12/13/2014 1640   GFRAA 43* 12/13/2014 1640    No results for input(s): LIPASE, AMYLASE in the last 168 hours. No results for input(s): AMMONIA in the last 168 hours.  No results for input(s): CKTOTAL, CKMB, CKMBINDEX, TROPONINI in the last 168 hours. BNP (last 3 results) No results for input(s): PROBNP in the last 8760 hours.  Radiological Exams on Admission: Dg Chest 1 View  12/13/2014   CLINICAL DATA:  Right hip pain since a fall earlier today.  EXAM: CHEST - 1 VIEW  COMPARISON:  08/29/2007  FINDINGS: Heart size is normal. Pulmonary vascularity is at the upper limits of normal. Lungs are clear. There is no compression fracture in the mid thoracic spine.  IMPRESSION: No acute abnormalities of the chest.   Electronically Signed   By: Geanie Cooley M.D.   On: 12/13/2014 18:10   Dg Wrist Complete Right  12/13/2014   CLINICAL DATA:  Status post fall today with pain.  EXAM: RIGHT WRIST - COMPLETE 3+ VIEW  COMPARISON:  None.  FINDINGS: There is diffuse osteopenia. There is minimal displaced fracture of the distal radius. There is probable nondisplaced fracture of distal ulna. There is no dislocation.  IMPRESSION: Fracture of distal radius. Probable nondisplaced fracture of distal ulna.   Electronically Signed   By: Sherian Rein M.D.   On: 12/13/2014 18:09   Dg Hip Bilateral W/pelvis  12/13/2014   CLINICAL DATA:  Acute right hip pain after fall today.  EXAM: BILATERAL HIP WITH PELVIS - 4+ VIEW  COMPARISON:  None.  FINDINGS: Status post surgical fixation  of old proximal right femoral fracture. Diffuse osteopenia is noted. Mildly displaced fracture of the medial acetabulum is noted. Mild degenerative change of the left hip is noted.  IMPRESSION: Mildly displaced fracture of the medial acetabulum. CT scan is recommended for further evaluation.   Electronically Signed   By: Roque Lias M.D.   On: 12/13/2014 18:14   Ct Head Wo Contrast  12/13/2014   CLINICAL DATA:  Fall today.  EXAM: CT HEAD WITHOUT CONTRAST  CT CERVICAL SPINE WITHOUT CONTRAST  TECHNIQUE: Multidetector CT imaging of the head and cervical spine was performed following the standard protocol without intravenous contrast. Multiplanar CT image reconstructions of  the cervical spine were also generated.  COMPARISON:  MRI brain 08/20/2007  FINDINGS: CT HEAD FINDINGS  A remote left PCA territory infarct it is noted. Other areas of remote encephalomalacia include in the anterior left frontal lobe than the posterior right frontal lobe and parietal lobe are otherwise stable. Diffuse white matter changes are evident. Remote lacunar infarcts are noted in the basal ganglia bilaterally. Extensive remote lacunar infarcts are present within both cerebellar hemispheres.  No acute cortical infarct, hemorrhage, or mass lesion is present. The ventricles are proportionate to the degree of atrophy with ex vacuo dilation of the left lateral ventricle in particular. No significant extraaxial fluid collection is present.  The paranasal sinuses and mastoid air cells are clear. The calvarium is intact. Atherosclerotic calcifications are present within the cavernous internal carotid arteries bilaterally.  CT CERVICAL SPINE FINDINGS  Leftward curvature of the cervical spine is noted. Vertebral body heights AP alignment are maintained. No acute fracture or traumatic subluxation is evident. Mild degenerative changes are noted at the C1-2 arch. The soft tissues of the neck demonstrate atherosclerotic calcifications bilaterally, worse  on the right. No other focal lesions are evident. Severe emphysematous changes are present.  IMPRESSION: 1. Multiple remote infarcts as described. 2. Remote lacunar infarcts within the basal ganglia and cerebellum. 3. No acute intracranial abnormality or evidence for acute trauma. 4. Levoconvex curvature of the cervical spine without evidence for acute trauma. 5. Severe emphysematous changes.   Electronically Signed   By: Gennette Pac M.D.   On: 12/13/2014 20:41   Ct Cervical Spine Wo Contrast  12/13/2014   CLINICAL DATA:  Fall today.  EXAM: CT HEAD WITHOUT CONTRAST  CT CERVICAL SPINE WITHOUT CONTRAST  TECHNIQUE: Multidetector CT imaging of the head and cervical spine was performed following the standard protocol without intravenous contrast. Multiplanar CT image reconstructions of the cervical spine were also generated.  COMPARISON:  MRI brain 08/20/2007  FINDINGS: CT HEAD FINDINGS  A remote left PCA territory infarct it is noted. Other areas of remote encephalomalacia include in the anterior left frontal lobe than the posterior right frontal lobe and parietal lobe are otherwise stable. Diffuse white matter changes are evident. Remote lacunar infarcts are noted in the basal ganglia bilaterally. Extensive remote lacunar infarcts are present within both cerebellar hemispheres.  No acute cortical infarct, hemorrhage, or mass lesion is present. The ventricles are proportionate to the degree of atrophy with ex vacuo dilation of the left lateral ventricle in particular. No significant extraaxial fluid collection is present.  The paranasal sinuses and mastoid air cells are clear. The calvarium is intact. Atherosclerotic calcifications are present within the cavernous internal carotid arteries bilaterally.  CT CERVICAL SPINE FINDINGS  Leftward curvature of the cervical spine is noted. Vertebral body heights AP alignment are maintained. No acute fracture or traumatic subluxation is evident. Mild degenerative changes are  noted at the C1-2 arch. The soft tissues of the neck demonstrate atherosclerotic calcifications bilaterally, worse on the right. No other focal lesions are evident. Severe emphysematous changes are present.  IMPRESSION: 1. Multiple remote infarcts as described. 2. Remote lacunar infarcts within the basal ganglia and cerebellum. 3. No acute intracranial abnormality or evidence for acute trauma. 4. Levoconvex curvature of the cervical spine without evidence for acute trauma. 5. Severe emphysematous changes.   Electronically Signed   By: Gennette Pac M.D.   On: 12/13/2014 20:41   Ct Pelvis Wo Contrast  12/13/2014   CLINICAL DATA:  Status post fall today; persistent moderate  right hip pain status post fall. CT recommended for further evaluation of right-sided pelvic fracture. Initial encounter.  EXAM: CT PELVIS WITHOUT CONTRAST  TECHNIQUE: Multidetector CT imaging of the pelvis was performed following the standard protocol without intravenous contrast.  COMPARISON:  Pelvis and bilateral hip radiographs performed earlier today at 5:31 p.m.  FINDINGS: There appears to be a T-shaped fracture through the right acetabulum; the transverse component is thought to be juxtatectal in nature, with perhaps 5 mm of separation. The anterior component demonstrates only minimal displacement, measuring perhaps 3 mm, and extends into the anterior wall more inferiorly, with partial disruption at the lateral aspect of the right superior pubic ramus. There is a minimally displaced fracture of the right inferior pubic ramus.  There also appears to be an avulsion fracture involving the right anterior inferior iliac spine, with mild displacement. No additional fractures are seen.  The sacroiliac joints are grossly unremarkable. The pubic symphysis is grossly unremarkable in appearance. The patient's right femoral hardware is grossly unremarkable in appearance, though difficult to fully characterize. No significant joint space narrowing is  seen. There is significant chronic compression deformity of vertebral body L4.  Visualized small and large bowel loops are grossly unremarkable. Diffuse calcification is seen along the distal abdominal aorta and its branches. Mild soft tissue hematoma is noted along the right pelvic sidewall, tracking minimally into the right lower quadrant in a retroperitoneal distribution, with mild leftward displacement of the bladder.  IMPRESSION: 1. T-shaped fracture of the right acetabulum; the transverse component is thought to be juxtatectal in nature. The anterior component extends into the anterior wall more inferiorly, with partial disruption at the lateral aspect of the right superior pubic ramus. Minimally displaced fracture of the right inferior pubic ramus. Displacement measures perhaps 5 mm at the transverse component and 3 mm at the anterior component. 2. Avulsion fracture involving the right anterior inferior iliac spine, with mild displacement. 3. Associated mild soft tissue hematoma along the right pelvic sidewall, tracking minimally into the right lower quadrant in a retroperitoneal distribution. 4. Diffuse calcification along the distal abdominal aorta and its branches. 5. Significant chronic compression deformity of vertebral body L4.   Electronically Signed   By: Roanna Raider M.D.   On: 12/13/2014 21:29    EKG: Independently reviewed. normal sinus rhythm, nonspecific ST and T waves changes.  Assessment/Plan Principal Problem:   Acetabular fracture Active Problems:   Femur fracture, right   Right radial fracture   Diabetes mellitus   CVA (cerebral infarction)   Smoker   Suspected clinically COPD (chronic obstructive pulmonary disease)   Dementia   Left wrist drop   1. Acetabular fracture The patient is presenting with complaints of a mechanical fall. He is found to have right closed acetabular fracture as well as right anterior inferior iliac spine fracture and soft tissue  hematoma. Orthopedic on-call has been consulted Dr. Charlann Boxer will be following up with the patient.  1) Cardiac risk: Based on RCRI does have following his factors >History of cerebrovascular disease He also has history of prior peripheral vascular disease but no coronary artery disease. He has diabetes mellitus but he does not require insulin at present.  With this the patient is a intermediate risk for adverse Cardiac outcome from surgery. the risks and benefits were discussed and acceptable to patient/family. hold  Plavix at present in view of mild hematoma as well as low platelets.  2) Pulmonary risk: The pt has history of  active smoker, and appears  to have clinically  COPD Recommend continue use of PRN nebulizer, and optimization of lund function with use of inhalers Good pulmunary toilet.  3) General risk: Avoid major fluctuation in blood pressure intra-op and post operatively. Minimal sedation and Narcotics due to his history of dementia to avoid delirium.  2. Diabetes mellitus. Holding his oral hypoglycemic agent and placing the patient on sliding scale.  3. hypoxia. Patient appears to have history of COPD and does not appear to have any acute COPD exacerbation. Although his chest x-ray is clear as well as exam will closely monitor him on telemetry. Likely secondary to narcotic pain medication and pain itself causing patient to hypoventilate. A further worsening requires further workup including an ABG. Placing the patient on bronchodilator as well as anticholinergic inhalers along with inhaled steroids.  4. right radial fracture. Discussed with Dr. Kristine Linea who recommended sling for the patient at present and they will follow up on him in the morning.  Advance goals of care discfull code as per my discussion with patient and his wife,    ConsOrthopedic and hand surgery  DVT Prophylaxis: mechanical compression device NutrNothing by mouth after midnight  Family Commfamily  was present at bedside, opportunity was given to ask question and all questions were answered satisfactorily at the time of interview. Disposition: Admitted to inpatient in telemetry unit.  Author: Lynden Oxford, MD [facility] Pager: [phone] 12/13/2014, 11:58 PM    If 7PM-7AM, please contact night-coverage [email or web address] Password [email or web address]

## 2014-12-13 NOTE — ED Notes (Signed)
Pt lives at home with wife, she states that she found him on the floor. Patient has left side weakness from prior stroke, but does not use any assisting devices. Wife states he tripped over a dog bone.

## 2014-12-13 NOTE — ED Notes (Addendum)
Pt in CT, uncomfortable, alert, interactive, cooperative. Remains on [place]. [facility] updated.

## 2014-12-13 NOTE — ED Provider Notes (Addendum)
CSN: [id number]     Arrival date & time 12/13/14  1631 History   This chart was scribed for Tilden Fossa, MD by Modena Jansky, ED Scribe. This patient was seen in room MH10/MH10 and the patient's care was started at 4:45 PM.   Chief Complaint  Patient presents with  . Fall   The history is provided by the patient and a relative. No language interpreter was used.   HPI Comments: ABNER ARDIS is a 79 y.o. male who presents to the Emergency Department complaining of a fall that occurred today. Family states that pt was playing with his dog and accidentally stepped on a bone. They did not witness the fall and pt cannot recall how he fell. They state they just found him sitting in pain and called EMS. He denies any head injury or LOC. Pt reports that he has constant moderate right hip pain. Family member states that pt is currently on plavix. Pt denies any neck pain, headache, fever, or vomiting.   Past Medical History  Diagnosis Date  . Diabetes mellitus without complication   . Stroke    No past surgical history on file. No family history on file. History  Substance Use Topics  . Smoking status: Not on file  . Smokeless tobacco: Not on file  . Alcohol Use: Not on file    Review of Systems  Constitutional: Negative for fever.  Gastrointestinal: Negative for vomiting.  Musculoskeletal: Positive for myalgias (hip pain). Negative for neck pain.  Neurological: Negative for syncope and headaches.  Hematological: Bruises/bleeds easily.  All other systems reviewed and are negative.  Allergies  Solarcaine  Home Medications   Prior to Admission medications   Medication Sig Start Date End Date Taking? Authorizing Provider  clopidogrel (PLAVIX) 75 MG tablet Take 75 mg by mouth daily.   Yes Historical Provider, MD  donepezil (ARICEPT ODT) 10 MG disintegrating tablet Take 10 mg by mouth at bedtime.   Yes Historical Provider, MD  gabapentin (NEURONTIN) 300 MG capsule Take 300 mg by mouth 3  (three) times daily.   Yes Historical Provider, MD  glimepiride (AMARYL) 4 MG tablet Take 4 mg by mouth daily with breakfast.   Yes Historical Provider, MD  sitaGLIPtin (JANUVIA) 100 MG tablet Take 100 mg by mouth daily.   Yes Historical Provider, MD   BP 131/103 mmHg  Temp(Src) 98.1 F (36.7 C) (Oral)  Resp 25  SpO2 91% Physical Exam  Constitutional: He appears well-developed and well-nourished.  HENT:  Head: Normocephalic and atraumatic.  Cardiovascular: Normal rate and regular rhythm.   No murmur heard. Pulmonary/Chest: Effort normal.  Occasional rhonchi  Abdominal: Soft. There is no tenderness. There is no rebound and no guarding.  Musculoskeletal:  Tenderness to palpation over the right distal forearm and wrist with decreased range of motion in the right wrist, good grip strength in the right hand, sensation light touch intact throughout right hand. Tenderness to palpation over bilateral hips. Kyphoscoliosis, without any C, T, L-spine tenderness.  Neurological: He is alert.  Disoriented to time, chronic left upper extremity weakness  Skin: Skin is warm and dry.  Psychiatric:  anxious  Nursing note and vitals reviewed.   ED Course  Procedures (including critical care time) SPLINT APPLICATION Date/Time: 12:21 AM Authorized by: Tilden Fossa Consent: Verbal consent obtained. Risks and benefits: risks, benefits and alternatives were discussed Consent given by: patient Splint applied by:ED technician Location details: RUE Splint type: sugar tong Post-procedure: The splinted body part was neurovascularly  unchanged following the procedure. Patient tolerance: Patient tolerated the procedure well with no immediate complications.     DIAGNOSTIC STUDIES: Oxygen Saturation is 91% on RA, normal by my interpretation.    COORDINATION OF CARE: 4:49 PM- Pt advised of plan for treatment which includes medication and radiology and pt agrees.  Labs Review Labs Reviewed   COMPREHENSIVE METABOLIC PANEL - Abnormal; Notable for the following:    Potassium 5.2 (*)    Glucose, Bld 189 (*)    Creatinine, Ser 1.70 (*)    Albumin 3.3 (*)    GFR calc non Af Amer 37 (*)    GFR calc Af Amer 43 (*)    All other components within normal limits  CBC WITH DIFFERENTIAL - Abnormal; Notable for the following:    RBC 3.65 (*)    Hemoglobin 12.2 (*)    HCT 36.9 (*)    MCV 101.1 (*)    Platelets 95 (*)    Neutrophils Relative % 83 (*)    Lymphocytes Relative 10 (*)    All other components within normal limits  URINALYSIS, ROUTINE W REFLEX MICROSCOPIC    Imaging Review Dg Chest 1 View  12/13/2014   CLINICAL DATA:  Right hip pain since a fall earlier today.  EXAM: CHEST - 1 VIEW  COMPARISON:  08/29/2007  FINDINGS: Heart size is normal. Pulmonary vascularity is at the upper limits of normal. Lungs are clear. There is no compression fracture in the mid thoracic spine.  IMPRESSION: No acute abnormalities of the chest.   Electronically Signed   By: Geanie Cooley M.D.   On: 12/13/2014 18:10   Dg Wrist Complete Right  12/13/2014   CLINICAL DATA:  Status post fall today with pain.  EXAM: RIGHT WRIST - COMPLETE 3+ VIEW  COMPARISON:  None.  FINDINGS: There is diffuse osteopenia. There is minimal displaced fracture of the distal radius. There is probable nondisplaced fracture of distal ulna. There is no dislocation.  IMPRESSION: Fracture of distal radius. Probable nondisplaced fracture of distal ulna.   Electronically Signed   By: Sherian Rein M.D.   On: 12/13/2014 18:09   Dg Hip Bilateral W/pelvis  12/13/2014   CLINICAL DATA:  Acute right hip pain after fall today.  EXAM: BILATERAL HIP WITH PELVIS - 4+ VIEW  COMPARISON:  None.  FINDINGS: Status post surgical fixation of old proximal right femoral fracture. Diffuse osteopenia is noted. Mildly displaced fracture of the medial acetabulum is noted. Mild degenerative change of the left hip is noted.  IMPRESSION: Mildly displaced fracture of  the medial acetabulum. CT scan is recommended for further evaluation.   Electronically Signed   By: Roque Lias M.D.   On: 12/13/2014 18:14     EKG Interpretation   Date/Time:  Saturday December 13 2014 18:30:53 EST Ventricular Rate:  70 PR Interval:  202 QRS Duration: 80 QT Interval:  384 QTC Calculation: 414 R Axis:   76 Text Interpretation:  Normal sinus rhythm with sinus arrhythmia Normal ECG  Confirmed by Lincoln Brigham [id number]) on 12/13/2014 6:35:00 PM      MDM   Final diagnoses:  Fall  Acetabular fracture, right, closed, initial encounter  Distal radius fracture, right, closed, initial encounter    D/w Dr Vallery Sa with Orthopedics, he will see patient in consult after transfer.  D/w Dr. Izora Ribas with hand - recommends wrist splint.  D/w Dr. Allena Katz with medicine - will add on CT head given unwittnessed fall, low platelets.  He will see pt  in transfer.    I personally performed the services described in this documentation, which was scribed in my presence. The recorded information has been reviewed and is accurate.     Tilden Fossa, MD 12/14/14 0023  Tilden Fossa, MD 12/14/14 [phone]

## 2014-12-13 NOTE — Progress Notes (Signed)
Report received from Jonny Ruiz, RN at [facility] ED.

## 2014-12-13 NOTE — ED Notes (Signed)
Patient unable to give urine sample at this time

## 2014-12-13 NOTE — ED Notes (Signed)
Patient placed on 2L  Leslie for 02 at 86%

## 2014-12-13 NOTE — ED Notes (Signed)
Pt uncomfortable, unable to communicate or define his pain, restless, unable to find position of comfort, pinpoints pain to R arm, hip and leg. Family x2 at [facility]. Pt mouth breathing, intermittent low SPO2 88% RA. Placed on O2 [place] 4L.

## 2014-12-13 NOTE — ED Notes (Signed)
Report given to [facility], pt back from CT, alert, NAD, calm, still, interactive.

## 2014-12-14 ENCOUNTER — Inpatient Hospital Stay (HOSPITAL_COMMUNITY): Payer: Medicare Other

## 2014-12-14 DIAGNOSIS — Z8673 Personal history of transient ischemic attack (TIA), and cerebral infarction without residual deficits: Secondary | ICD-10-CM | POA: Diagnosis present

## 2014-12-14 DIAGNOSIS — F0390 Unspecified dementia without behavioral disturbance: Secondary | ICD-10-CM | POA: Diagnosis present

## 2014-12-14 DIAGNOSIS — E119 Type 2 diabetes mellitus without complications: Secondary | ICD-10-CM

## 2014-12-14 DIAGNOSIS — M21332 Wrist drop, left wrist: Secondary | ICD-10-CM | POA: Diagnosis present

## 2014-12-14 DIAGNOSIS — S5291XA Unspecified fracture of right forearm, initial encounter for closed fracture: Secondary | ICD-10-CM | POA: Diagnosis present

## 2014-12-14 DIAGNOSIS — N189 Chronic kidney disease, unspecified: Secondary | ICD-10-CM

## 2014-12-14 DIAGNOSIS — F17200 Nicotine dependence, unspecified, uncomplicated: Secondary | ICD-10-CM | POA: Diagnosis present

## 2014-12-14 DIAGNOSIS — J449 Chronic obstructive pulmonary disease, unspecified: Secondary | ICD-10-CM | POA: Diagnosis present

## 2014-12-14 DIAGNOSIS — E1122 Type 2 diabetes mellitus with diabetic chronic kidney disease: Secondary | ICD-10-CM

## 2014-12-14 DIAGNOSIS — S7291XA Unspecified fracture of right femur, initial encounter for closed fracture: Secondary | ICD-10-CM | POA: Diagnosis present

## 2014-12-14 LAB — GLUCOSE, CAPILLARY
GLUCOSE-CAPILLARY: 140 mg/dL — AB (ref 70–99)
Glucose-Capillary: 72 mg/dL (ref 70–99)
Glucose-Capillary: 156 mg/dL — ABNORMAL HIGH (ref 70–99)
Glucose-Capillary: 90 mg/dL (ref 70–99)

## 2014-12-14 LAB — CBC
HEMATOCRIT: 35.2 % — AB (ref 39.0–52.0)
HEMOGLOBIN: 10.9 g/dL — AB (ref 13.0–17.0)
MCH: 31.5 pg (ref 26.0–34.0)
MCHC: 31 g/dL (ref 30.0–36.0)
MCV: 101.7 fL — AB (ref 78.0–100.0)
PLATELETS: 89 10*3/uL — AB (ref 150–400)
RBC: 3.46 MIL/uL — ABNORMAL LOW (ref 4.22–5.81)
RDW: 14.9 % (ref 11.5–15.5)
WBC: 9 10*3/uL (ref 4.0–10.5)

## 2014-12-14 LAB — COMPREHENSIVE METABOLIC PANEL
ALK PHOS: 60 U/L (ref 39–117)
ALT: 11 U/L (ref 0–53)
AST: 15 U/L (ref 0–37)
Albumin: 2.9 g/dL — ABNORMAL LOW (ref 3.5–5.2)
Anion gap: 3 — ABNORMAL LOW (ref 5–15)
BILIRUBIN TOTAL: 0.4 mg/dL (ref 0.3–1.2)
BUN: 23 mg/dL (ref 6–23)
CO2: 26 mmol/L (ref 19–32)
Calcium: 8.1 mg/dL — ABNORMAL LOW (ref 8.4–10.5)
Chloride: 111 mEq/L (ref 96–112)
Creatinine, Ser: 1.7 mg/dL — ABNORMAL HIGH (ref 0.50–1.35)
GFR calc Af Amer: 43 mL/min — ABNORMAL LOW (ref >90)
GFR calc non Af Amer: 37 mL/min — ABNORMAL LOW (ref >90)
GLUCOSE: 168 mg/dL — AB (ref 70–99)
POTASSIUM: 5.3 mmol/L — AB (ref 3.5–5.1)
Sodium: 140 mmol/L (ref 135–145)
TOTAL PROTEIN: 5.5 g/dL — AB (ref 6.0–8.3)

## 2014-12-14 LAB — URINE MICROSCOPIC-ADD ON

## 2014-12-14 LAB — ABO/RH: ABO/RH(D): A POS

## 2014-12-14 LAB — URINALYSIS, ROUTINE W REFLEX MICROSCOPIC
GLUCOSE, UA: NEGATIVE mg/dL
HGB URINE DIPSTICK: NEGATIVE
Ketones, ur: NEGATIVE mg/dL
Leukocytes, UA: NEGATIVE
Nitrite: NEGATIVE
PH: 5.5 (ref 5.0–8.0)
PROTEIN: 30 mg/dL — AB
UROBILINOGEN UA: 0.2 mg/dL (ref 0.0–1.0)

## 2014-12-14 LAB — HEMOGLOBIN A1C
Hgb A1c MFr Bld: 6 % — ABNORMAL HIGH (ref <5.7)
Mean Plasma Glucose: 126 mg/dL — ABNORMAL HIGH (ref <117)

## 2014-12-14 LAB — TYPE AND SCREEN
ABO/RH(D): A POS
Antibody Screen: NEGATIVE

## 2014-12-14 LAB — BRAIN NATRIURETIC PEPTIDE: B Natriuretic Peptide: 173.6 pg/mL — ABNORMAL HIGH (ref 0.0–100.0)

## 2014-12-14 MED ORDER — NICOTINE 14 MG/24HR TD PT24
14.0000 mg | MEDICATED_PATCH | Freq: Every day | TRANSDERMAL | Status: DC
  Administered 2014-12-14 – 2014-12-15 (×2): 14 mg via TRANSDERMAL
  Filled 2014-12-14 (×2): qty 1

## 2014-12-14 MED ORDER — ALBUTEROL SULFATE (2.5 MG/3ML) 0.083% IN NEBU
2.5000 mg | INHALATION_SOLUTION | RESPIRATORY_TRACT | Status: DC | PRN
  Filled 2014-12-14: qty 3

## 2014-12-14 MED ORDER — SODIUM POLYSTYRENE SULFONATE 15 GM/60ML PO SUSP
15.0000 g | Freq: Once | ORAL | Status: AC
  Administered 2014-12-14: 15 g via ORAL
  Filled 2014-12-14: qty 60

## 2014-12-14 NOTE — Progress Notes (Addendum)
PROGRESS NOTE  Johnathan [name] ZOX:[id number] DOB: [birth year] DOA: 12/13/2014 PCP: Kaleen Mask, MD  HPI: Johnathan [name] is a 79 y.o. male with Past medical history of diabetes mellitus, CVA, suspected COPD, history of right femur fracture 2005, history of left wrist drop since childhood dementia. Patient presents with complaints of a fall. He mentions that he was playing with his dog and he placed his foot on dog's bone and lost his balance and fell down. He denied any injury to head or neck. The fall was not witnessed but after the fall after hearing the thudd wife came to the room and found the patient sitting on the floor with complaining of pain in his right hand as well as right hip. With this the patient was taken to [facility] and from that he was transferred here for right acetabular fracture. At the time of my evaluation patient denied any complaint of prior shortness of breath or chest pain patient denies any complaint of current shortness of breath or chest pain. He denies any cough fever or chills denies any nausea vomiting denies any choking episode. He denies any leg swelling. Denies any dizziness or lightheadedness. His only complaint is pain in his right hip. He does not have any significant pain in his right hand. He is able to move his right fingers and feel them. Patient had a right intertrochanteric femur fracture 2005 operated by Dr. Georgena Spurling  Subjective / 24 H Interval events - no complaints this morning, has underlying confusion - wife at bedside   Assessment/Plan: Principal Problem:   Acetabular fracture Active Problems:   Femur fracture, right   Right radial fracture   Diabetes mellitus   CVA (cerebral infarction)   Smoker   Suspected clinically COPD (chronic obstructive pulmonary disease)   Dementia   Left wrist drop   Acetabular fracture - The patient is presenting with complaints of a mechanical fall. He is found to have right closed  acetabular fracture as well as right anterior inferior iliac spine fracture and soft tissue hematoma. - Orthopedic surgery consulted, Dr. Ranell Patrick saw patient this morning, may need surgery but not today. Allow diet.   COPD - Stable, no wheezing  CKD stage III -  baseline creatinine 1.5-2, as far back as 2008  - Creatinine at 1.7 this time, stable  - his CK D is likely due to his underlying diabetes  Hyperkalemia - likely in the setting of chronic renal disease, continue to monitor, Kayexalate today  Anemia - due to possible chronic medical conditions, - Continue to monitor  DM - continue sliding scale -  recheck a hemoglobin A1c, last value in 2008 showed poor control at 7.2   Respiratory failure with hypoxemia - this is likely acute on chronic, patient has underlying COPD, he is not using oxygen at home. He is breathing comfortably and is in no distress, has no wheezing on exam.  Right radial fracture. - Admitting MD discussed with Dr. Kristine Linea who recommended sling for the patient at present and they will follow up on him in the morning.  Dementia - patient had a CVA a few years back and probably has underlying vascular dementia, over the last year or year and a half he has been having a progressive decline in terms of his cognition as well as his functional level, he walks some in the house but mostly stays in a wheelchair.  Prior CVA - stable, no new focal findings  Tobacco abuse  Diet: Diet Heart Fluids: NS at 50 cc/h DVT Prophylaxis: SCD  Code Status: Full Code Family Communication: d/w wife bedside  Disposition Plan: inpatient  Consultants:  Orthopedic surgery   Procedures:  None    Antibiotics  Anti-infectives    None       Studies  Dg Chest 1 View No acute abnormalities of the chest.   Electronically Signed   By: Geanie Cooley M.D.   On: 12/13/2014 18:10   Dg Wrist Complete Right Fracture of distal radius. Probable nondisplaced fracture of distal ulna.    Electronically Signed   By: Sherian Rein M.D.   On: 12/13/2014 18:09   Dg Hip Bilateral W/pelvis Mildly displaced fracture of the medial acetabulum. CT scan is recommended for further evaluation.   Electronically Signed   By: Roque Lias M.D.   On: 12/13/2014 18:14   Ct Head Wo Contrast 1. Multiple remote infarcts as described. 2. Remote lacunar infarcts within the basal ganglia and cerebellum. 3. No acute intracranial abnormality or evidence for acute trauma. 4. Levoconvex curvature of the cervical spine without evidence for acute trauma. 5. Severe emphysematous changes.   Electronically Signed   By: Gennette Pac M.D.   On: 12/13/2014 20:41   Ct Cervical Spine Wo Contrast 12/13/2014 1. Multiple remote infarcts as described. 2. Remote lacunar infarcts within the basal ganglia and cerebellum. 3. No acute intracranial abnormality or evidence for acute trauma. 4. Levoconvex curvature of the cervical spine without evidence for acute trauma. 5. Severe emphysematous changes.   Electronically Signed   By: Gennette Pac M.D.   On: 12/13/2014 20:41   Ct Pelvis Wo Contrast 12/13/2014 1. T-shaped fracture of the right acetabulum; the transverse component is thought to be juxtatectal in nature. The anterior component extends into the anterior wall more inferiorly, with partial disruption at the lateral aspect of the right superior pubic ramus. Minimally displaced fracture of the right inferior pubic ramus. Displacement measures perhaps 5 mm at the transverse component and 3 mm at the anterior component. 2. Avulsion fracture involving the right anterior inferior iliac spine, with mild displacement. 3. Associated mild soft tissue hematoma along the right pelvic sidewall, tracking minimally into the right lower quadrant in a retroperitoneal distribution. 4. Diffuse calcification along the distal abdominal aorta and its branches. 5. Significant chronic compression deformity of vertebral body L4.   Electronically Signed    By: Roanna Raider M.D.   On: 12/13/2014 21:29   Dg Chest Port 1 View 12/14/2014   CLINICAL DATA:Suggestion of slight atelectasis in the left lung base.   Electronically Signed   By: Burman Nieves M.D.   On: 12/14/2014 04:38    Objective  Filed Vitals:   12/14/14 0655 12/14/14 0800 12/14/14 1007 12/14/14 1010  BP: 105/38  97/43   Pulse:   73   Temp:   99.6 F (37.6 C)   TempSrc:   Oral   Resp:  22 28   Height:      Weight:      SpO2:  95% 95% 96%    Intake/Output Summary (Last 24 hours) at 12/14/14 1223 Last data filed at 12/14/14 1027  Gross per 24 hour  Intake  492.5 ml  Output    220 ml  Net  272.5 ml   Filed Weights   12/13/14 2345  Weight: 72.167 kg (159 lb 1.6 oz)    Exam:  General:  NAD, pleasant, confused  HEENT: no scleral icterus  Cardiovascular: RRR without  murmurs  Respiratory: CTA biL  Abdomen: soft, non tender  MSK/Extremities: no cyanosis  Skin: no rashes  Neuro: non focal, limited evaluation on right   Data Reviewed: Basic Metabolic Panel:  Recent Labs Lab 12/13/14 1640 12/14/14 0601  NA 140 140  K 5.2* 5.3*  CL 109 111  CO2 26 26  GLUCOSE 189* 168*  BUN 20 23  CREATININE 1.70* 1.70*  CALCIUM 8.6 8.1*   Liver Function Tests:  Recent Labs Lab 12/13/14 1640 12/14/14 0601  AST 18 15  ALT 11 11  ALKPHOS 69 60  BILITOT 0.5 0.4  PROT 6.5 5.5*  ALBUMIN 3.3* 2.9*   CBC:  Recent Labs Lab 12/13/14 1640 12/14/14 0601  WBC 8.1 9.0  NEUTROABS 6.8  --   HGB 12.2* 10.9*  HCT 36.9* 35.2*  MCV 101.1* 101.7*  PLT 95* 89*   CBG:  Recent Labs Lab 12/13/14 2216 12/14/14 0901  GLUCAP 202* 140*   Scheduled Meds: . donepezil  10 mg Oral QHS  . gabapentin  300 mg Oral BID  . insulin aspart  0-15 Units Subcutaneous TID WC  . insulin aspart  0-5 Units Subcutaneous QHS  . mometasone-formoterol  2 puff Inhalation BID  . nicotine  14 mg Transdermal Daily  . tiotropium  18 mcg Inhalation Daily   Continuous Infusions: .  sodium chloride 1,000 mL (12/14/14 1146)    Pamella Pert, MD [facility] Pager [phone]. If 7 PM - 7 AM, please contact night-coverage at [email or web address], password [facility] 12/14/2014, 12:23 PM  LOS: 1 day

## 2014-12-14 NOTE — Progress Notes (Addendum)
NURSING PROGRESS NOTE  [name] [id number] Admission Data: 12/14/2014 12:20 AM Attending Provider: Lynden Oxford, MD WUJ:[id number],NWGNFA Johnathan Millin, MD Code Status:full   Johnathan [name] is a 79 y.o. male patient admitted from [facility]   -No acute distress noted.  -No complaints of shortness of breath.  -No complaints of chest pain.   Cardiac Monitoring: Box # 25in place. Cardiac monitor yields:NSR  Blood pressure 142/46, pulse 86, temperature 98.3 F (36.8 C), temperature source Oral, resp. rate 16, height 5' 10.8" (1.798 m), weight 72.167 kg (159 lb 1.6 oz), SpO2 91 %.   IV Fluids:  IV in place, occlusive dsg intact without redness, IV cath intact in lt hand Allergies:  Solarcaine  Past Medical History:   has a past medical history of Diabetes mellitus without complication; Stroke; and COPD (chronic obstructive pulmonary disease).  Past Surgical History:   has no past surgical history on file.  Social History:   reports that he has been smoking Cigarettes.  He has been smoking about 2.00 packs per day. He does not have any smokeless tobacco history on file. He reports that he does not drink alcohol.  Skin: rt wrist and hip fracture  Patient/Family orientated to room. Information packet given to patient/family. Admission inpatient armband information verified with patient/family to include name and date of birth and placed on patient arm. Side rails up x 3, fall assessment and education completed with patient/family. Patient/family able to verbalize understanding of risk associated with falls and verbalized understanding to call for assistance before getting out of bed. Call light within reach. Patient/family able to voice and demonstrate understanding of unit orientation instructions.

## 2014-12-14 NOTE — Consult Note (Signed)
Reason for Consult:Right acetabular fracture Referring Physician: Tyliek Timberman is an 79 y.o. male.  HPI: 79 yo male s/p unwitnessed fall yesterday afternoon.  Patient complained of severe right wrist and right hip pain.  Presented to the [facility] ED for eval.  Noted on eval to have right wrist fracture and right acetabular fracture.  Patient admitted to telemetry.  Dr Lenon Curt consulted on the right wrist fracture.  Patient currently complaining of the right hip and leg way more than the right wrist which is immobilized in a sugar tong splint. 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  Has simple pinch only on the left.  Past Medical History  Diagnosis Date  . Diabetes mellitus without complication   . Stroke   . COPD (chronic obstructive pulmonary disease)     No past surgical history on file.  No family history on file.  Social History:  reports that he has been smoking Cigarettes.  He has been smoking about 2.00 packs per day. He does not have any smokeless tobacco history on file. He reports that he does not drink alcohol. His drug history is not on file.  Allergies:  Allergies  Allergen Reactions  . Solarcaine [Benzocaine]     Medications: I have reviewed the patient's current medications.  Results for orders placed or performed during the hospital encounter of 12/13/14 (from the past 48 hour(s))  Comprehensive metabolic panel     Status: Abnormal   Collection Time: 12/13/14  4:40 PM  Result Value Ref Range   Sodium 140 135 - 145 mmol/L    Comment: Please note change in reference range.   Potassium 5.2 (H) 3.5 - 5.1 mmol/L    Comment: Please note change in reference range.   Chloride 109 96 - 112 mEq/L   CO2 26 19 - 32 mmol/L   Glucose, Bld 189 (H) 70 - 99 mg/dL   BUN 20 6 - 23 mg/dL   Creatinine, Ser 1.70 (H) 0.50 - 1.35 mg/dL   Calcium 8.6 8.4 - 10.5 mg/dL   Total Protein 6.5 6.0 - 8.3 g/dL   Albumin 3.3 (L) 3.5 -  5.2 g/dL   AST 18 0 - 37 U/L   ALT 11 0 - 53 U/L   Alkaline Phosphatase 69 39 - 117 U/L   Total Bilirubin 0.5 0.3 - 1.2 mg/dL   GFR calc non Af Amer 37 (L) >90 mL/min   GFR calc Af Amer 43 (L) >90 mL/min    Comment: (NOTE) The eGFR has been calculated using the CKD EPI equation. This calculation has not been validated in all clinical situations. eGFR's persistently <90 mL/min signify possible Chronic Kidney Disease.    Anion gap 5 5 - 15  CBC with Differential     Status: Abnormal   Collection Time: 12/13/14  4:40 PM  Result Value Ref Range   WBC 8.1 4.0 - 10.5 K/uL   RBC 3.65 (L) 4.22 - 5.81 MIL/uL   Hemoglobin 12.2 (L) 13.0 - 17.0 g/dL   HCT 36.9 (L) 39.0 - 52.0 %   MCV 101.1 (H) 78.0 - 100.0 fL   MCH 33.4 26.0 - 34.0 pg   MCHC 33.1 30.0 - 36.0 g/dL   RDW 14.6 11.5 - 15.5 %   Platelets 95 (L) 150 - 400 K/uL    Comment: REPEATED TO VERIFY PLATELET COUNT CONFIRMED BY SMEAR    Neutrophils Relative % 83 (  H) 43 - 77 %   Neutro Abs 6.8 1.7 - 7.7 K/uL   Lymphocytes Relative 10 (L) 12 - 46 %   Lymphs Abs 0.8 0.7 - 4.0 K/uL   Monocytes Relative 5 3 - 12 %   Monocytes Absolute 0.4 0.1 - 1.0 K/uL   Eosinophils Relative 2 0 - 5 %   Eosinophils Absolute 0.1 0.0 - 0.7 K/uL   Basophils Relative 0 0 - 1 %   Basophils Absolute 0.0 0.0 - 0.1 K/uL  Glucose, capillary     Status: Abnormal   Collection Time: 12/13/14 10:16 PM  Result Value Ref Range   Glucose-Capillary 202 (H) 70 - 99 mg/dL   Comment 1 Notify RN   Type and screen     Status: None   Collection Time: 12/14/14 12:10 AM  Result Value Ref Range   ABO/RH(D) A POS    Antibody Screen NEG    Sample Expiration 12/17/2014   Brain natriuretic peptide     Status: Abnormal   Collection Time: 12/14/14 12:10 AM  Result Value Ref Range   B Natriuretic Peptide 173.6 (H) 0.0 - 100.0 pg/mL    Comment: Please note change in reference range.  ABO/Rh     Status: None   Collection Time: 12/14/14 12:10 AM  Result Value Ref Range    ABO/RH(D) A POS   Urinalysis, Routine w reflex microscopic     Status: Abnormal   Collection Time: 12/14/14  5:45 AM  Result Value Ref Range   Color, Urine YELLOW YELLOW   APPearance CLEAR CLEAR   Specific Gravity, Urine >1.030 (H) 1.005 - 1.030   pH 5.5 5.0 - 8.0   Glucose, UA NEGATIVE NEGATIVE mg/dL   Hgb urine dipstick NEGATIVE NEGATIVE   Bilirubin Urine SMALL (A) NEGATIVE   Ketones, ur NEGATIVE NEGATIVE mg/dL   Protein, ur 30 (A) NEGATIVE mg/dL   Urobilinogen, UA 0.2 0.0 - 1.0 mg/dL   Nitrite NEGATIVE NEGATIVE   Leukocytes, UA NEGATIVE NEGATIVE  Urine microscopic-add on     Status: Abnormal   Collection Time: 12/14/14  5:45 AM  Result Value Ref Range   WBC, UA 0-2 <3 WBC/hpf   RBC / HPF 0-2 <3 RBC/hpf   Bacteria, UA RARE RARE   Casts HYALINE CASTS (A) NEGATIVE   Urine-Other MUCOUS PRESENT   CBC     Status: Abnormal   Collection Time: 12/14/14  6:01 AM  Result Value Ref Range   WBC 9.0 4.0 - 10.5 K/uL   RBC 3.46 (L) 4.22 - 5.81 MIL/uL   Hemoglobin 10.9 (L) 13.0 - 17.0 g/dL   HCT 78.8 (L) 99.6 - 49.8 %   MCV 101.7 (H) 78.0 - 100.0 fL   MCH 31.5 26.0 - 34.0 pg   MCHC 31.0 30.0 - 36.0 g/dL   RDW 51.9 03.4 - 74.5 %   Platelets 89 (L) 150 - 400 K/uL    Comment: PLATELET COUNT CONFIRMED BY SMEAR REPEATED TO VERIFY   Comprehensive metabolic panel     Status: Abnormal   Collection Time: 12/14/14  6:01 AM  Result Value Ref Range   Sodium 140 135 - 145 mmol/L    Comment: Please note change in reference range.   Potassium 5.3 (H) 3.5 - 5.1 mmol/L    Comment: Please note change in reference range.   Chloride 111 96 - 112 mEq/L   CO2 26 19 - 32 mmol/L   Glucose, Bld 168 (H) 70 - 99 mg/dL   BUN  23 6 - 23 mg/dL   Creatinine, Ser 2.68 (H) 0.50 - 1.35 mg/dL   Calcium 8.1 (L) 8.4 - 10.5 mg/dL   Total Protein 5.5 (L) 6.0 - 8.3 g/dL   Albumin 2.9 (L) 3.5 - 5.2 g/dL   AST 15 0 - 37 U/L   ALT 11 0 - 53 U/L   Alkaline Phosphatase 60 39 - 117 U/L   Total Bilirubin 0.4 0.3 - 1.2  mg/dL   GFR calc non Af Amer 37 (L) >90 mL/min   GFR calc Af Amer 43 (L) >90 mL/min    Comment: (NOTE) The eGFR has been calculated using the CKD EPI equation. This calculation has not been validated in all clinical situations. eGFR's persistently <90 mL/min signify possible Chronic Kidney Disease.    Anion gap 3 (L) 5 - 15  Glucose, capillary     Status: Abnormal   Collection Time: 12/14/14  9:01 AM  Result Value Ref Range   Glucose-Capillary 140 (H) 70 - 99 mg/dL    Dg Chest 1 View  02/12/1959   CLINICAL DATA:  Right hip pain since a fall earlier today.  EXAM: CHEST - 1 VIEW  COMPARISON:  08/29/2007  FINDINGS: Heart size is normal. Pulmonary vascularity is at the upper limits of normal. Lungs are clear. There is no compression fracture in the mid thoracic spine.  IMPRESSION: No acute abnormalities of the chest.   Electronically Signed   By: Geanie Cooley M.D.   On: 12/13/2014 18:10   Dg Wrist Complete Right  12/13/2014   CLINICAL DATA:  Status post fall today with pain.  EXAM: RIGHT WRIST - COMPLETE 3+ VIEW  COMPARISON:  None.  FINDINGS: There is diffuse osteopenia. There is minimal displaced fracture of the distal radius. There is probable nondisplaced fracture of distal ulna. There is no dislocation.  IMPRESSION: Fracture of distal radius. Probable nondisplaced fracture of distal ulna.   Electronically Signed   By: Sherian Rein M.D.   On: 12/13/2014 18:09   Dg Hip Bilateral W/pelvis  12/13/2014   CLINICAL DATA:  Acute right hip pain after fall today.  EXAM: BILATERAL HIP WITH PELVIS - 4+ VIEW  COMPARISON:  None.  FINDINGS: Status post surgical fixation of old proximal right femoral fracture. Diffuse osteopenia is noted. Mildly displaced fracture of the medial acetabulum is noted. Mild degenerative change of the left hip is noted.  IMPRESSION: Mildly displaced fracture of the medial acetabulum. CT scan is recommended for further evaluation.   Electronically Signed   By: Roque Lias M.D.   On:  12/13/2014 18:14   Ct Head Wo Contrast  12/13/2014   CLINICAL DATA:  Fall today.  EXAM: CT HEAD WITHOUT CONTRAST  CT CERVICAL SPINE WITHOUT CONTRAST  TECHNIQUE: Multidetector CT imaging of the head and cervical spine was performed following the standard protocol without intravenous contrast. Multiplanar CT image reconstructions of the cervical spine were also generated.  COMPARISON:  MRI brain 08/20/2007  FINDINGS: CT HEAD FINDINGS  A remote left PCA territory infarct it is noted. Other areas of remote encephalomalacia include in the anterior left frontal lobe than the posterior right frontal lobe and parietal lobe are otherwise stable. Diffuse white matter changes are evident. Remote lacunar infarcts are noted in the basal ganglia bilaterally. Extensive remote lacunar infarcts are present within both cerebellar hemispheres.  No acute cortical infarct, hemorrhage, or mass lesion is present. The ventricles are proportionate to the degree of atrophy with ex vacuo dilation of the left  lateral ventricle in particular. No significant extraaxial fluid collection is present.  The paranasal sinuses and mastoid air cells are clear. The calvarium is intact. Atherosclerotic calcifications are present within the cavernous internal carotid arteries bilaterally.  CT CERVICAL SPINE FINDINGS  Leftward curvature of the cervical spine is noted. Vertebral body heights AP alignment are maintained. No acute fracture or traumatic subluxation is evident. Mild degenerative changes are noted at the C1-2 arch. The soft tissues of the neck demonstrate atherosclerotic calcifications bilaterally, worse on the right. No other focal lesions are evident. Severe emphysematous changes are present.  IMPRESSION: 1. Multiple remote infarcts as described. 2. Remote lacunar infarcts within the basal ganglia and cerebellum. 3. No acute intracranial abnormality or evidence for acute trauma. 4. Levoconvex curvature of the cervical spine without evidence  for acute trauma. 5. Severe emphysematous changes.   Electronically Signed   By: Lawrence Santiago M.D.   On: 12/13/2014 20:41   Ct Cervical Spine Wo Contrast  12/13/2014   CLINICAL DATA:  Fall today.  EXAM: CT HEAD WITHOUT CONTRAST  CT CERVICAL SPINE WITHOUT CONTRAST  TECHNIQUE: Multidetector CT imaging of the head and cervical spine was performed following the standard protocol without intravenous contrast. Multiplanar CT image reconstructions of the cervical spine were also generated.  COMPARISON:  MRI brain 08/20/2007  FINDINGS: CT HEAD FINDINGS  A remote left PCA territory infarct it is noted. Other areas of remote encephalomalacia include in the anterior left frontal lobe than the posterior right frontal lobe and parietal lobe are otherwise stable. Diffuse white matter changes are evident. Remote lacunar infarcts are noted in the basal ganglia bilaterally. Extensive remote lacunar infarcts are present within both cerebellar hemispheres.  No acute cortical infarct, hemorrhage, or mass lesion is present. The ventricles are proportionate to the degree of atrophy with ex vacuo dilation of the left lateral ventricle in particular. No significant extraaxial fluid collection is present.  The paranasal sinuses and mastoid air cells are clear. The calvarium is intact. Atherosclerotic calcifications are present within the cavernous internal carotid arteries bilaterally.  CT CERVICAL SPINE FINDINGS  Leftward curvature of the cervical spine is noted. Vertebral body heights AP alignment are maintained. No acute fracture or traumatic subluxation is evident. Mild degenerative changes are noted at the C1-2 arch. The soft tissues of the neck demonstrate atherosclerotic calcifications bilaterally, worse on the right. No other focal lesions are evident. Severe emphysematous changes are present.  IMPRESSION: 1. Multiple remote infarcts as described. 2. Remote lacunar infarcts within the basal ganglia and cerebellum. 3. No acute  intracranial abnormality or evidence for acute trauma. 4. Levoconvex curvature of the cervical spine without evidence for acute trauma. 5. Severe emphysematous changes.   Electronically Signed   By: Lawrence Santiago M.D.   On: 12/13/2014 20:41   Ct Pelvis Wo Contrast  12/13/2014   CLINICAL DATA:  Status post fall today; persistent moderate right hip pain status post fall. CT recommended for further evaluation of right-sided pelvic fracture. Initial encounter.  EXAM: CT PELVIS WITHOUT CONTRAST  TECHNIQUE: Multidetector CT imaging of the pelvis was performed following the standard protocol without intravenous contrast.  COMPARISON:  Pelvis and bilateral hip radiographs performed earlier today at 5:31 p.m.  FINDINGS: There appears to be a T-shaped fracture through the right acetabulum; the transverse component is thought to be juxtatectal in nature, with perhaps 5 mm of separation. The anterior component demonstrates only minimal displacement, measuring perhaps 3 mm, and extends into the anterior wall more inferiorly, with partial  disruption at the lateral aspect of the right superior pubic ramus. There is a minimally displaced fracture of the right inferior pubic ramus.  There also appears to be an avulsion fracture involving the right anterior inferior iliac spine, with mild displacement. No additional fractures are seen.  The sacroiliac joints are grossly unremarkable. The pubic symphysis is grossly unremarkable in appearance. The patient's right femoral hardware is grossly unremarkable in appearance, though difficult to fully characterize. No significant joint space narrowing is seen. There is significant chronic compression deformity of vertebral body L4.  Visualized small and large bowel loops are grossly unremarkable. Diffuse calcification is seen along the distal abdominal aorta and its branches. Mild soft tissue hematoma is noted along the right pelvic sidewall, tracking minimally into the right lower quadrant  in a retroperitoneal distribution, with mild leftward displacement of the bladder.  IMPRESSION: 1. T-shaped fracture of the right acetabulum; the transverse component is thought to be juxtatectal in nature. The anterior component extends into the anterior wall more inferiorly, with partial disruption at the lateral aspect of the right superior pubic ramus. Minimally displaced fracture of the right inferior pubic ramus. Displacement measures perhaps 5 mm at the transverse component and 3 mm at the anterior component. 2. Avulsion fracture involving the right anterior inferior iliac spine, with mild displacement. 3. Associated mild soft tissue hematoma along the right pelvic sidewall, tracking minimally into the right lower quadrant in a retroperitoneal distribution. 4. Diffuse calcification along the distal abdominal aorta and its branches. 5. Significant chronic compression deformity of vertebral body L4.   Electronically Signed   By: Roanna Raider M.D.   On: 12/13/2014 21:29   Dg Chest Port 1 View  12/14/2014   CLINICAL DATA:  Shortness of breath. Patient fell in broke right pelvis.  EXAM: PORTABLE CHEST - 1 VIEW  COMPARISON:  12/13/2014  FINDINGS: Shallow inspiration. Heart size and pulmonary vascularity are normal for technique. Suggestion of mild atelectasis in the left lung base. No blunting of costophrenic angles. No pneumothorax. Calcified and tortuous aorta. Degenerative changes in the spine and shoulders.  IMPRESSION: Suggestion of slight atelectasis in the left lung base.   Electronically Signed   By: Burman Nieves M.D.   On: 12/14/2014 04:38    ROS Blood pressure 105/38, pulse 74, temperature 99.9 F (37.7 C), temperature source Oral, resp. rate 26, height 5' 10.8" (1.798 m), weight 72.167 kg (159 lb 1.6 oz), SpO2 100 %. Physical Exam   Patient resting in bed in moderate distress.  Bilateral shoulders nontender with no deformity.  Left UE with obvious flexion deformity of the left wrist and  hand.  Only able to do a simple grip.  Right arm with an above elbow splint.  Able to wiggle fingers. Minimal swelling.  Left LE with pain free AROM. No tenderness about the knee or ankle, good pulses, normal sensation. Left LE with tenderness at the knee but minimal swelling,  Intense pain with any movement of the leg to include log roll. Ankle is nontender. Normal pulses distally and sensation intact.   Assessment/Plan: 79 yo male with fall yesterday with right medial and anterior wall acetabular fracture with anterior column involvement. Patient also has a right wrist fracture that Dr Izora Ribas will be managing.  I will contact Dr Myrene Galas concerning Mr Tamblyn.  It is possible that he will require surgery to help him from a pain and mobility standpoint and to prevent fracture displacement. Bedrest and non weight bearing L LE for  now.  DVT prophylaxis.  No pelvis surgery today. Will follow  Darl Kuss,STEVEN R 12/14/2014, 9:47 AM

## 2014-12-14 NOTE — Progress Notes (Addendum)
Received alert from [facility] . Pt had 6 runs  beat of  v-tach. Pt is asymtomatic. Vss. No s/s of distress noted. Oncall provider Benedetto Coons, NP notified via text page.  At [id number] order received . Will cont to monitor.

## 2014-12-14 NOTE — Progress Notes (Signed)
Called for PRN treatment for patient increased Respiratory rate. Upon exam patient has BBS Clear diminished in the bases with NO distress. Only increased Respiratory rate, but patient is taking shallow breaths and is complaining of Pain everywhere. NO treatment needed at this time, But spoke with RN and she is going to give some pain medications.

## 2014-12-14 NOTE — Progress Notes (Signed)
Pt is tachypnic (RR upto 40's ). Pt has shallow breathing. Pt is encouraged for deep breathing and coughing with minimal effect. No s/s of distress noted. O2 sats 96% on 2 liter of nasal cannula. On-call provider notified by text page.  Order received . Will cont to monitor.

## 2014-12-15 ENCOUNTER — Encounter (HOSPITAL_COMMUNITY): Payer: Self-pay | Admitting: *Deleted

## 2014-12-15 ENCOUNTER — Inpatient Hospital Stay (HOSPITAL_COMMUNITY): Payer: Medicare Other

## 2014-12-15 LAB — CBC
HEMATOCRIT: 34.6 % — AB (ref 39.0–52.0)
Hemoglobin: 10.8 g/dL — ABNORMAL LOW (ref 13.0–17.0)
MCH: 31.5 pg (ref 26.0–34.0)
MCHC: 31.2 g/dL (ref 30.0–36.0)
MCV: 100.9 fL — ABNORMAL HIGH (ref 78.0–100.0)
PLATELETS: 75 10*3/uL — AB (ref 150–400)
RBC: 3.43 MIL/uL — ABNORMAL LOW (ref 4.22–5.81)
RDW: 14.9 % (ref 11.5–15.5)
WBC: 9.2 10*3/uL (ref 4.0–10.5)

## 2014-12-15 LAB — COMPREHENSIVE METABOLIC PANEL
ALT: 12 U/L (ref 0–53)
AST: 23 U/L (ref 0–37)
Albumin: 2.7 g/dL — ABNORMAL LOW (ref 3.5–5.2)
Alkaline Phosphatase: 59 U/L (ref 39–117)
Anion gap: 4 — ABNORMAL LOW (ref 5–15)
BILIRUBIN TOTAL: 0.7 mg/dL (ref 0.3–1.2)
BUN: 21 mg/dL (ref 6–23)
CO2: 24 mmol/L (ref 19–32)
Calcium: 8.1 mg/dL — ABNORMAL LOW (ref 8.4–10.5)
Chloride: 109 mEq/L (ref 96–112)
Creatinine, Ser: 1.56 mg/dL — ABNORMAL HIGH (ref 0.50–1.35)
GFR calc Af Amer: 48 mL/min — ABNORMAL LOW (ref >90)
GFR, EST NON AFRICAN AMERICAN: 41 mL/min — AB (ref >90)
Glucose, Bld: 113 mg/dL — ABNORMAL HIGH (ref 70–99)
Potassium: 4.4 mmol/L (ref 3.5–5.1)
SODIUM: 137 mmol/L (ref 135–145)
Total Protein: 5.2 g/dL — ABNORMAL LOW (ref 6.0–8.3)

## 2014-12-15 LAB — GLUCOSE, CAPILLARY
GLUCOSE-CAPILLARY: 199 mg/dL — AB (ref 70–99)
Glucose-Capillary: 117 mg/dL — ABNORMAL HIGH (ref 70–99)
Glucose-Capillary: 152 mg/dL — ABNORMAL HIGH (ref 70–99)
Glucose-Capillary: 122 mg/dL — ABNORMAL HIGH (ref 70–99)

## 2014-12-15 LAB — MAGNESIUM: Magnesium: 1.6 mg/dL (ref 1.5–2.5)

## 2014-12-15 MED ORDER — BISACODYL 10 MG RE SUPP
10.0000 mg | Freq: Once | RECTAL | Status: AC
  Administered 2014-12-15: 10 mg via RECTAL
  Filled 2014-12-15: qty 1

## 2014-12-15 MED ORDER — ENSURE COMPLETE PO LIQD
237.0000 mL | Freq: Two times a day (BID) | ORAL | Status: DC
  Administered 2014-12-16 – 2014-12-18 (×4): 237 mL via ORAL

## 2014-12-15 MED ORDER — HEPARIN SODIUM (PORCINE) 5000 UNIT/ML IJ SOLN
5000.0000 [IU] | Freq: Three times a day (TID) | INTRAMUSCULAR | Status: DC
  Administered 2014-12-15 – 2014-12-16 (×3): 5000 [IU] via SUBCUTANEOUS
  Filled 2014-12-15 (×5): qty 1

## 2014-12-15 MED ORDER — NICOTINE 7 MG/24HR TD PT24
7.0000 mg | MEDICATED_PATCH | Freq: Every day | TRANSDERMAL | Status: DC
  Administered 2014-12-15: 7 mg via TRANSDERMAL
  Filled 2014-12-15 (×2): qty 1

## 2014-12-15 NOTE — Progress Notes (Signed)
PROGRESS NOTE  Johnathan [name] ZOX:[id number] DOB: June 21, 1936 DOA: 12/13/2014 PCP: Kaleen Mask, MD  HPI: Johnathan [name] is a 79 y.o. male with Past medical history of diabetes mellitus, CVA,, history of right femur fracture 2005, history of left wrist drop since childhood, and dementia. Patient presents with complaints of a fall.  The fall was not witnessed but after  hearing the thud his wife came to the room and found the patient sitting on the floor with complaining of pain in his right hand as well as right hip. With this the patient was taken to [facility] and from that he was transferred here for right acetabular fracture.  Subjective / 24 H Interval events Wife concerned about patient having right knee pain.  Concerned that the knee has not been xrayed.    Assessment/Plan: Principal Problem:   Acetabular fracture Active Problems:   Femur fracture, right   Right radial fracture   Diabetes mellitus   CVA (cerebral infarction)   Smoker   Suspected clinically COPD (chronic obstructive pulmonary disease)   Dementia   Left wrist drop  Right Acetabular fracture  - The patient is presenting with complaints of a mechanical fall. He is found to have right closed acetabular fracture as well as right anterior inferior iliac spine fracture and soft tissue hematoma. - Orthopedic surgery was consulted, per Dr. Doreatha Martin PA - No surgery planned for the hip.  Conservative treatment.  Touch Down Weight Bearing Status. They will see the patient later on 1/4.  Right radial fracture.  Admitting MD discussed with Dr. Izora Ribas who recommended splinting the wrist.  Outpatient follow up has been scheduled in [facility] office for next Monday 1/11 at 9:00 am.   Low grade fever Likely due to multiple fractures.  Will continue to monitor.  No antibiotics at this point.  COPD Stable, no wheezing  CKD stage III  Baseline creatinine 1.5-2, as far back as 2008  Creatinine at 1.7 this time,  stable  His CKD is likely due to his underlying diabetes  Hyperkalemia Likely in the setting of chronic renal disease.  Resolved with kayexalate.  Anemia  Due to possible chronic medical conditions, Continue to monitor  DM  Continue sliding scale Hemoglobin A1c is 6.0  Respiratory failure with hypoxemia  This is likely acute on chronic, patient has underlying COPD, he is not using oxygen at home. He is breathing comfortably and is in no distress, has no wheezing on exam.  Dementia  Patient had a CVA a few years back and probably has underlying vascular dementia, over the last year or year and a half he has been having a progressive decline in terms of his cognition as well as his functional level, he walks some in the house but mostly stays in a wheelchair.  Prior CVA - stable, no new focal findings  Tobacco abuse  Ortho recommends discontinuation of nicotine patch.  This was discussed with the patient's wife who was opposed to completely discontinuing the nicotine.  A lower dose nicotine patch was ordered.   Diet: Diet Heart Fluids: saline lock. DVT Prophylaxis: Heparin q8  Code Status: Full Code Family Communication: d/w wife bedside  Disposition Plan: inpatient  Consultants:  Orthopedic surgery   Hand surgery via telephone.  Procedures:  None    Antibiotics  Anti-infectives    None       Studies  Dg Chest 1 View No acute abnormalities of the chest.   Electronically Signed  By: Geanie Cooley M.D.   On: 12/13/2014 18:10   Dg Wrist Complete Right Fracture of distal radius. Probable nondisplaced fracture of distal ulna.   Electronically Signed   By: Sherian Rein M.D.   On: 12/13/2014 18:09   Dg Hip Bilateral W/pelvis Mildly displaced fracture of the medial acetabulum. CT scan is recommended for further evaluation.   Electronically Signed   By: Roque Lias M.D.   On: 12/13/2014 18:14   Ct Head Wo Contrast 1. Multiple remote infarcts as described. 2. Remote  lacunar infarcts within the basal ganglia and cerebellum. 3. No acute intracranial abnormality or evidence for acute trauma. 4. Levoconvex curvature of the cervical spine without evidence for acute trauma. 5. Severe emphysematous changes.   Electronically Signed   By: Gennette Pac M.D.   On: 12/13/2014 20:41   Ct Cervical Spine Wo Contrast 12/13/2014 1. Multiple remote infarcts as described. 2. Remote lacunar infarcts within the basal ganglia and cerebellum. 3. No acute intracranial abnormality or evidence for acute trauma. 4. Levoconvex curvature of the cervical spine without evidence for acute trauma. 5. Severe emphysematous changes.   Electronically Signed   By: Gennette Pac M.D.   On: 12/13/2014 20:41   Ct Pelvis Wo Contrast 12/13/2014 1. T-shaped fracture of the right acetabulum; the transverse component is thought to be juxtatectal in nature. The anterior component extends into the anterior wall more inferiorly, with partial disruption at the lateral aspect of the right superior pubic ramus. Minimally displaced fracture of the right inferior pubic ramus. Displacement measures perhaps 5 mm at the transverse component and 3 mm at the anterior component. 2. Avulsion fracture involving the right anterior inferior iliac spine, with mild displacement. 3. Associated mild soft tissue hematoma along the right pelvic sidewall, tracking minimally into the right lower quadrant in a retroperitoneal distribution. 4. Diffuse calcification along the distal abdominal aorta and its branches. 5. Significant chronic compression deformity of vertebral body L4.   Electronically Signed   By: Roanna Raider M.D.   On: 12/13/2014 21:29   Dg Chest Port 1 View 12/14/2014   CLINICAL DATA:Suggestion of slight atelectasis in the left lung base.   Electronically Signed   By: Burman Nieves M.D.   On: 12/14/2014 04:38    Objective  Filed Vitals:   12/15/14 0400 12/15/14 0535 12/15/14 0747 12/15/14 1200  BP:  128/45    Pulse:   80    Temp:  100.1 F (37.8 C)    TempSrc:  Oral    Resp: Height:      Weight:      SpO2: 94% 90% 93%     Intake/Output Summary (Last 24 hours) at 12/15/14 1342 Last data filed at 12/15/14 0929  Gross per 24 hour  Intake 1607.5 ml  Output    700 ml  Net  907.5 ml   Filed Weights   12/13/14 2345  Weight: 72.167 kg (159 lb 1.6 oz)    Exam:  General:  NAD, pleasant, Awake, Alert, Wife at bedside. Oxygen in place.  HEENT: no scleral icterus  Cardiovascular: RRR without murmurs.    Respiratory: CTA biL, no increased work of breathing.  Abdomen: soft, Acutely tender to palpation in LLQ.  Non distended, +bs.  MSK/Extremities: no cyanosis, warm.  Mild swelling in LLE.  Right arm bandaged in gauze.   Data Reviewed: Basic Metabolic Panel:  Recent Labs Lab 12/13/14 1640 12/14/14 0030 12/14/14 0601 12/15/14 0030  NA 140  --  140 137  K 5.2*  --  5.3* 4.4  CL 109  --  111 109  CO2 26  --  26 24  GLUCOSE 189*  --  168* 113*  BUN 20  --  23 21  CREATININE 1.70*  --  1.70* 1.56*  CALCIUM 8.6  --  8.1* 8.1*  MG  --  1.6  --   --    Liver Function Tests:  Recent Labs Lab 12/13/14 1640 12/14/14 0601 12/15/14 0030  AST ALT ALKPHOS 69 60 59  BILITOT 0.5 0.4 0.7  PROT 6.5 5.5* 5.2*  ALBUMIN 3.3* 2.9* 2.7*   CBC:  Recent Labs Lab 12/13/14 1640 12/14/14 0601 12/15/14 0030  WBC 8.1 9.0 9.2  NEUTROABS 6.8  --   --   HGB 12.2* 10.9* 10.8*  HCT 36.9* 35.2* 34.6*  MCV 101.1* 101.7* 100.9*  PLT 95* 89* 75*   CBG:  Recent Labs Lab 12/14/14 1212 12/14/14 1714 12/14/14 2157 12/15/14 0742 12/15/14 1142  GLUCAP 72 156* 90 117* 152*   Scheduled Meds: . bisacodyl  10 mg Rectal Once  . donepezil  10 mg Oral QHS  . gabapentin  300 mg Oral BID  . heparin subcutaneous  5,000 Units Subcutaneous 3 times per day  . insulin aspart  0-15 Units Subcutaneous TID WC  . insulin aspart  0-5 Units Subcutaneous QHS  .  mometasone-formoterol  2 puff Inhalation BID  . nicotine  7 mg Transdermal Daily  . tiotropium  18 mcg Inhalation Daily   Continuous Infusions:    Algis Downs, [place] [facility] Pager [phone]. If 7 PM - 7 AM, please contact night-coverage at [email or web address], password [facility] 12/15/2014, 1:42 PM  LOS: 2 days   Patient was seen, examined,treatment plan was discussed with the Advance Practice Provider.  I have directly reviewed the clinical findings, lab, imaging studies and management of this patient in detail. I have made the necessary changes to the above noted documentation, and agree with the documentation, as recorded by the Advance Practice Provider.   Pamella Pert, MD [facility] [phone]

## 2014-12-15 NOTE — Progress Notes (Addendum)
Received alert from CCMD; patient going in 2nd degree type II; patient asymptomatic, resting in bed; his wife at bedside. MD Gherghe notified and EKG was dome per MD order. EKG - NSR; normal EKG. Will continue to monitor the patient.

## 2014-12-15 NOTE — Progress Notes (Signed)
[facility]   Full consult to follow  Pt is a 79 y/o male with R acetabulum fracture and R distal radius fx (managed by Dr. Izora Ribas) Plan for non-operative management of R acetabulum fracture with TDWB Strongly advocate dc nicotine patch as this will increase pts chances for complications such as nonunion of fx  PT/OT evals Will likely need snf   [name], PA-C [facility] [phone] (P) 12/15/2014 11:22 AM

## 2014-12-15 NOTE — Progress Notes (Signed)
INITIAL NUTRITION ASSESSMENT  DOCUMENTATION CODES Per approved criteria  -Not Applicable   INTERVENTION: Ensure Complete po BID, each supplement provides 350 kcal and 13 grams of protein  NUTRITION DIAGNOSIS: Predicted suboptimal nutrient intake related to decreased appetite as evidenced by PO: 50%.   Goal: Pt will meet >90% of estimated nutritional needs  Monitor:  PO/supplement intake, labs, weight changes, I/O's  Reason for Assessment: MST=2  79 y.o. male  Admitting Dx: Acetabular fracture  Johnathan [name] is a 79 y.o. male with Past medical history of diabetes mellitus, CVA,, history of right femur fracture 2005, history of left wrist drop since childhood, and dementia. Patient presents with complaints of a fall. The fall was not witnessed but after hearing the thud his wife came to the room and found the patient sitting on the floor with complaining of pain in his right hand as well as right hip. With this the patient was taken to [facility] and from that he was transferred here for right acetabular fracture.  ASSESSMENT: Pt admitted with rt acetabular fracture. Per MD notes, no surgery planned at this time.  Pt being taken down to radiology at time of visit. Nutrition-focused physical exam deferred at this time.  Intake is fair; noted 50% meal completion. No hx available to assess weight changes at this time. Will add supplement to promote nutritional adequacy,  Labs reviewed. Creat: 1.56. Calcium: 8.1, Glucose: 113. CBGS: 90-152.  Height: Ht Readings from Last 1 Encounters:  12/13/14 5' 10.8" (1.798 m)    Weight: Wt Readings from Last 1 Encounters:  12/13/14 159 lb 1.6 oz (72.167 kg)    Ideal Body Weight: 171#  % Ideal Body Weight: 93%  Wt Readings from Last 10 Encounters:  12/13/14 159 lb 1.6 oz (72.167 kg)    Usual Body Weight: unknown  % Usual Body Weight: unknown  BMI:  Body mass index is 22.32 kg/(m^2). Normal weight range  Estimated  Nutritional Needs: Kcal: 2000-2200 Protein: 86-96 grams Fluid: 2.0-2.2 L  Skin: Intact  Diet Order: Diet Heart  EDUCATION NEEDS: -Education not appropriate at this time   Intake/Output Summary (Last 24 hours) at 12/15/14 1437 Last data filed at 12/15/14 0929  Gross per 24 hour  Intake 1607.5 ml  Output    700 ml  Net  907.5 ml    Last BM: 12/13/14   Labs:   Recent Labs Lab 12/13/14 1640 12/14/14 0030 12/14/14 0601 12/15/14 0030  NA 140  --  140 137  K 5.2*  --  5.3* 4.4  CL 109  --  111 109  CO2 26  --  26 24  BUN 20  --  23 21  CREATININE 1.70*  --  1.70* 1.56*  CALCIUM 8.6  --  8.1* 8.1*  MG  --  1.6  --   --   GLUCOSE 189*  --  168* 113*    CBG (last 3)   Recent Labs  12/14/14 2157 12/15/14 0742 12/15/14 1142  GLUCAP 90 117* 152*    Scheduled Meds: . bisacodyl  10 mg Rectal Once  . donepezil  10 mg Oral QHS  . gabapentin  300 mg Oral BID  . heparin subcutaneous  5,000 Units Subcutaneous 3 times per day  . insulin aspart  0-15 Units Subcutaneous TID WC  . insulin aspart  0-5 Units Subcutaneous QHS  . mometasone-formoterol  2 puff Inhalation BID  . nicotine  7 mg Transdermal Daily  . tiotropium  18  mcg Inhalation Daily    Continuous Infusions:   Past Medical History  Diagnosis Date  . Diabetes mellitus without complication   . Stroke   . COPD (chronic obstructive pulmonary disease)     History reviewed. No pertinent past surgical history.  Johnathan [name] A. Mayford Knife, RD, LDN, CDE Pager: [phone] After hours Pager: [phone]

## 2014-12-15 NOTE — Progress Notes (Signed)
Orthopedic Tech Progress Note Patient Details:  Johnathan [name] Aug 15, 1936 [id number]  Patient ID: Johnathan [name], male   DOB: May 15, 1936, 79 y.o.   MRN: [id number] Pt unable to use trapeze bar patient helper  Nikki Dom 12/15/2014, 2:07 PM

## 2014-12-16 ENCOUNTER — Encounter (HOSPITAL_COMMUNITY): Payer: Self-pay | Admitting: Orthopedic Surgery

## 2014-12-16 ENCOUNTER — Inpatient Hospital Stay (HOSPITAL_COMMUNITY): Payer: Medicare Other

## 2014-12-16 DIAGNOSIS — E559 Vitamin D deficiency, unspecified: Secondary | ICD-10-CM | POA: Diagnosis present

## 2014-12-16 DIAGNOSIS — S32409D Unspecified fracture of unspecified acetabulum, subsequent encounter for fracture with routine healing: Secondary | ICD-10-CM

## 2014-12-16 LAB — GLUCOSE, CAPILLARY
GLUCOSE-CAPILLARY: 169 mg/dL — AB (ref 70–99)
GLUCOSE-CAPILLARY: 209 mg/dL — AB (ref 70–99)
Glucose-Capillary: 158 mg/dL — ABNORMAL HIGH (ref 70–99)
Glucose-Capillary: 137 mg/dL — ABNORMAL HIGH (ref 70–99)

## 2014-12-16 LAB — CBC
HCT: 30.9 % — ABNORMAL LOW (ref 39.0–52.0)
Hemoglobin: 9.8 g/dL — ABNORMAL LOW (ref 13.0–17.0)
MCH: 31.5 pg (ref 26.0–34.0)
MCHC: 31.7 g/dL (ref 30.0–36.0)
MCV: 99.4 fL (ref 78.0–100.0)
Platelets: 66 10*3/uL — ABNORMAL LOW (ref 150–400)
RBC: 3.11 MIL/uL — ABNORMAL LOW (ref 4.22–5.81)
RDW: 14.8 % (ref 11.5–15.5)
WBC: 7.2 10*3/uL (ref 4.0–10.5)

## 2014-12-16 LAB — VITAMIN D 25 HYDROXY (VIT D DEFICIENCY, FRACTURES): Vit D, 25-Hydroxy: 4.5 ng/mL — ABNORMAL LOW (ref 30.0–100.0)

## 2014-12-16 LAB — SAVE SMEAR

## 2014-12-16 MED ORDER — OXYCODONE HCL 5 MG PO TABS
5.0000 mg | ORAL_TABLET | ORAL | Status: DC | PRN
  Administered 2014-12-17 (×2): 10 mg via ORAL
  Administered 2014-12-17: 5 mg via ORAL
  Administered 2014-12-18: 10 mg via ORAL
  Filled 2014-12-16: qty 1
  Filled 2014-12-16 (×3): qty 2

## 2014-12-16 MED ORDER — ACETAMINOPHEN 500 MG PO TABS: 500.0000 mg | ORAL_TABLET | Freq: Four times a day (QID) | ORAL | Status: DC | PRN

## 2014-12-16 MED ORDER — MORPHINE SULFATE 2 MG/ML IJ SOLN
2.0000 mg | INTRAMUSCULAR | Status: DC | PRN
  Administered 2014-12-16 – 2014-12-18 (×3): 2 mg via INTRAVENOUS
  Filled 2014-12-16 (×4): qty 1

## 2014-12-16 MED ORDER — ADULT MULTIVITAMIN W/MINERALS CH
1.0000 | ORAL_TABLET | Freq: Every day | ORAL | Status: DC
  Administered 2014-12-16 – 2014-12-18 (×3): 1 via ORAL
  Filled 2014-12-16 (×3): qty 1

## 2014-12-16 MED ORDER — TRAMADOL HCL 50 MG PO TABS: 50.0000 mg | ORAL_TABLET | Freq: Three times a day (TID) | ORAL | Status: DC | PRN

## 2014-12-16 MED ORDER — VITAMIN D (ERGOCALCIFEROL) 1.25 MG (50000 UNIT) PO CAPS
50000.0000 [IU] | ORAL_CAPSULE | ORAL | Status: DC
  Administered 2014-12-16: 50000 [IU] via ORAL
  Filled 2014-12-16 (×2): qty 1

## 2014-12-16 MED ORDER — VITAMIN D3 25 MCG (1000 UNIT) PO TABS
2000.0000 [IU] | ORAL_TABLET | Freq: Every day | ORAL | Status: DC
  Administered 2014-12-16 – 2014-12-18 (×3): 2000 [IU] via ORAL
  Filled 2014-12-16 (×3): qty 2

## 2014-12-16 NOTE — Evaluation (Signed)
Physical Therapy Evaluation Patient Details Name: Johnathan [name] MRN: [id number] DOB: [birth year] Today's Date: 12/16/2014   History of Present Illness  Pt is a 79 y.o. male adm due to fall resulting in R acetabulum fracture and Rt distal radius fx. From ortho standpoint pt is being treated non surgically. pt with hx of Lt wrist drop and dementia.   Clinical Impression  Pt adm due to above. Pt greatly limited in evaluation due to pain and resisting movement secondary to pain. Pt to benefit from skilled acute PT to address deficits and maximize functional mobility prior to D/C. Discussed at length goals of care from physical therapy standpoint with wife. Pt will require SNF upon acute D/C to increase independence with wheelchair transfers to return home with wife.     Follow Up Recommendations SNF;Supervision/Assistance - 24 hour    Equipment Recommendations  Other (comment) (TBD)    Recommendations for Other Services       Precautions / Restrictions Precautions Precautions: Fall Restrictions Weight Bearing Restrictions: Yes RUE Weight Bearing: Weight bear through elbow only (per ortho PA in room) RLE Weight Bearing: Touchdown weight bearing      Mobility  Bed Mobility Overal bed mobility: Needs Assistance;+2 for physical assistance Bed Mobility: Rolling;Supine to Sit;Sit to Supine Rolling: Max assist   Supine to sit: +2 for physical assistance;HOB elevated;Total assist Sit to supine: +2 for physical assistance;Total assist;HOB elevated   General bed mobility comments: pt takes a very long time with bed mobility; attempting to move Rt LE independently but unable; use of helicopter technique and draw pad to bring hips off EOB; pt unable to tolerate and bringing Rt LE up to chest and resisting at trunk to sit; pt required immediate return to supine and total (A) of 2 person to reposition in bed  Transfers                 General transfer comment: will require lift at this  time; unable to assess due to pain   Ambulation/Gait                Stairs            Wheelchair Mobility    Modified Rankin (Stroke Patients Only)       Balance Overall balance assessment: History of Falls;Needs assistance     Sitting balance - Comments: unable to come up to full sitting position due to pain and resisting                                     Pertinent Vitals/Pain Pain Assessment: Faces Faces Pain Scale: Hurts whole lot Pain Location: Rt hip with movement Pain Descriptors / Indicators: Grimacing;Guarding Pain Intervention(s): Monitored during session;Limited activity within patient's tolerance;Premedicated before session;Other (comment);Repositioned    Home Living Family/patient expects to be discharged to:: Skilled nursing facility Living Arrangements: Spouse/significant other               Additional Comments: Pt living with wife and ambulating without AD    Prior Function Level of Independence: Needs assistance   Gait / Transfers Assistance Needed: per wife, pt in wheelchair predominately but was able to ambulate short household distance as needed  ADL's / Homemaking Assistance Needed: total (A )        Hand Dominance        Extremity/Trunk Assessment   Upper Extremity Assessment: Defer to OT evaluation  Lower Extremity Assessment: RLE deficits/detail RLE Deficits / Details: AROM WFL; pt pulling away and brining Rt hip to chest in pain with movement; unable to assess strength     Cervical / Trunk Assessment: Normal  Communication   Communication: No difficulties  Cognition Arousal/Alertness: Awake/alert Behavior During Therapy: Anxious Overall Cognitive Status: History of cognitive impairments - at baseline (hx of dementia ) Area of Impairment: Problem solving;Safety/judgement;Following commands;Awareness;Memory     Memory: Decreased recall of precautions Following Commands: Follows one  step commands with increased time     Problem Solving: Decreased initiation;Slow processing;Requires tactile cues;Requires verbal cues      General Comments General comments (skin integrity, edema, etc.): discussed importance of Rt LE ROM and repositioning with pt and wife; pt at incr risk of skin breakdown and pressure sores due to incontience and decr mobility     Exercises Low Level/ICU Exercises Ankle Circles/Pumps: AROM;AAROM;Both;10 reps;Supine Heel Slides: AAROM;AROM;Right;5 reps;Supine;Limitations Heel Slides Limitations: pain Other Exercises Other Exercises: educated on importance of Rt UE elevation for edema control      Assessment/Plan    PT Assessment Patient needs continued PT services  PT Diagnosis Difficulty walking;Generalized weakness;Acute pain   PT Problem List Decreased strength;Decreased range of motion;Decreased activity tolerance;Decreased balance;Decreased mobility;Decreased knowledge of use of DME;Decreased safety awareness;Decreased knowledge of precautions;Decreased cognition;Pain  PT Treatment Interventions DME instruction;Functional mobility training;Therapeutic activities;Therapeutic exercise;Balance training;Neuromuscular re-education;Cognitive remediation;Patient/family education;Wheelchair mobility training   PT Goals (Current goals can be found in the Care Plan section) Acute Rehab PT Goals Patient Stated Goal: wife's goal is for pt to go to rehab and not be discharged so soon PT Goal Formulation: With patient/family Time For Goal Achievement: 12/23/14 Potential to Achieve Goals: Fair    Frequency Min 3X/week   Barriers to discharge Decreased caregiver support      Co-evaluation               End of Session Equipment Utilized During Treatment: Oxygen Activity Tolerance: Patient limited by pain Patient left: in bed;with call bell/phone within reach;with bed alarm set;with family/visitor present;with nursing/sitter in room Nurse  Communication: Mobility status;Need for lift equipment;Precautions;Weight bearing status         Time: [id number] PT Time Calculation (min) (ACUTE ONLY): 28 min   Charges:   PT Evaluation $Initial PT Evaluation Tier I: 1 Procedure PT Treatments $Therapeutic Exercise: 8-22 mins $Therapeutic Activity: 8-22 mins   PT G CodesDonell [name]:        Johnathan [name], [place]  [phone] 12/16/2014, 9:26 AM

## 2014-12-16 NOTE — Care Management Note (Signed)
    Page 1 of 1   12/18/2014     4:36:55 PM CARE MANAGEMENT NOTE 12/18/2014  Patient:  Johnathan [name],Johnathan [name]   Account Number:  [id number]  Date Initiated:  12/16/2014  Documentation initiated by:  Letha CapeAYLOR,Edmund Holcomb  Subjective/Objective Assessment:   dx h hip and r wrist fx, fever, thrombocytopenia  admit- lives with spouse.     Action/Plan:   Anticipated DC Date:  12/18/2014   Anticipated DC Plan:  SKILLED NURSING FACILITY  In-house referral  Clinical Social Worker      DC Planning Services  CM consult      Choice offered to / List presented to:             Status of service:  Completed, signed off Medicare Important Message given?  YES (If response is "NO", the following Medicare IM given date fields will be blank) Date Medicare IM given:  12/16/2014 Medicare IM given by:  Letha CapeAYLOR,Emryn Flanery Date Additional Medicare IM given:   Additional Medicare IM given by:    Discharge Disposition:  SKILLED NURSING FACILITY  Per UR Regulation:  Reviewed for med. necessity/level of care/duration of stay  If discussed at Long Length of Stay Meetings, dates discussed:    Comments:  12/18/14 1635 Letha Capeeborah Darryn Kydd RN, BSN [phone] patient dc to [facility] in [place].  12/16/14 1520 Letha Capeeborah Dalicia Kisner RN, BSN [phone] patient lives with spouse, NCM will continue to follow for dc needs.

## 2014-12-16 NOTE — Clinical Social Work Psychosocial (Signed)
Clinical Social Work Department BRIEF PSYCHOSOCIAL ASSESSMENT 12/16/2014  Patient:  Johnathan [name],Johnathan [name]     Account Number:  [id number]     Admit date:  12/13/2014  Clinical Social Worker:  , BRYANT, LCSWA  Date/Time:  12/16/2014 04:11 PM  Referred by:  Physician  Date Referred:  12/16/2014 Referred for  SNF Placement   Other Referral:   NA   Interview type:  Family Other interview type:   Patient and wife interviewed at bedside to complete assessment.    PSYCHOSOCIAL DATA Living Status:  WIFE Admitted from facility:   Level of care:   Primary support name:  Johnathan [name] Primary support relationship to patient:  SPOUSE Degree of support available:   Support is strong.    CURRENT CONCERNS Current Concerns  Post-Acute Placement   Other Concerns:   NA    SOCIAL WORK ASSESSMENT / PLAN CSW met with patient and wife at bedside to complete assessment. Patient's wife is very involved in patient's care and has several questions about SNF search/placement process. CSW explained SNF search/placement process and answered wife's questions. Patient's wife asks for recommendations of SNFs, CSW explained that CSW cannot provide recommendations on facilities and encouraged her to research the facilities and visit them. Patient's wife appears calm and engaged in assessment. CSW will assist.   Assessment/plan status:  Psychosocial Support/Ongoing Assessment of Needs Other assessment/ plan:   Complete Fl2, Fax, PASRR   Information/referral to community resources:   CSW contact information and SNF list given.    PATIENT'S/FAMILY'S RESPONSE TO PLAN OF CARE: Patient's wife is agreeable to patient discharging to SNF for short term rehab when medically stable. CSW will follow up with available bed offers. CSW signing off at this time.       Bryant  MSW, LCSWA, LCASA, [phone] 

## 2014-12-16 NOTE — Clinical Social Work Placement (Signed)
Clinical Social Work Department CLINICAL SOCIAL WORK PLACEMENT NOTE 12/16/2014  Patient:  Johnathan [name],Johnathan [name]  Account Number:  [id number] Admit date:  12/13/2014  Clinical Social Worker:  Lavell LusterJOSEPH BRYANT Harsimran Westman, LCSWA  Date/time:  12/16/2014 04:43 PM  Clinical Social Work is seeking post-discharge placement for this patient at the following level of care:   SKILLED NURSING   (*CSW will update this form in Epic as items are completed)   12/16/2014  [facility]/family provided with [facility] Work's list of facilities offering this level of care within the geographic area requested by the patient (or if unable, by the patient's family).  12/16/2014  Patient/family informed of their freedom to choose among providers that offer the needed level of care, that participate in Medicare, Medicaid or managed care program needed by the patient, have an available bed and are willing to accept the patient.  12/16/2014  Patient/family informed of [facility]' ownership interest in [facility], as well as of the fact that they are under no obligation to receive care at this facility.  PASARR submitted to EDS on 12/16/2014 PASARR number received on 12/16/2014  FL2 transmitted to all facilities in geographic area requested by pt/family on  12/16/2014 FL2 transmitted to all facilities within larger geographic area on   Patient informed that his/her managed care company has contracts with or will negotiate with  certain facilities, including the following:     Patient/family informed of bed offers received:   Patient chooses bed at  Physician recommends and patient chooses bed at    Patient to be transferred to  on   Patient to be transferred to facility by  Patient and family notified of transfer on  Name of family member notified:    The following physician request were entered in Epic:   Additional Comments:    Roddie McBryant Sharan Mcenaney MSW, [place], [place], [phone]

## 2014-12-16 NOTE — Consult Note (Signed)
Orthopaedic Trauma Service (OTS)  Reason for Consult: R acetabulum fracture Referring Physician:  Gilberto Better    HPI: Johnathan [name] is an 79 y.o. white male who sustained a ground-level fall on 12/13/2014 while playing with his dog. The fall was unwitnessed. Patient has a medical history notable for diabetes, CVA, COPD, right hip fracture 2005, chronic left upper extremity deformity and disability. [facility] was [facility] where he was found to have a right acetabulum fracture as well as a right distal radius fracture. Patient was admitted to the medicine service. Orthopedics was consulted for his right acetabulum fracture and plastic/hand was consulted for his right distal radius fracture. Given the complexity of his right acetabulum fracture the orthopedic trauma service was asked to consult on the patient. Patient is seen today in [address] Wife is at bedside. Patient complains of right wrist, hip and knee pain. denies any numbness or tingling to his right lower extremity. Pain is exacerbated with movement and relieved with rest and pain medication. Physical therapy was working with patient on examination today. Several times patient spontaneously moved his right hip and to 90 without much difficulty. This seemed to be response to anticipation to therapy maneuvers.  Patient smokes about 2 packs a day. He has diabetes for which he takes Amaryl, Actos and Januvia. Patient lives with his wife in [place]. They live in a single-story dwelling. They do have a ramp to access the house. It sounds as if the patient's activity level is very minimal. Wife states that the patient does not use a cane or walker but he does grab on to the wall or things that he can grab a hold of to facilitate his ambulation. After getting up from bed and getting into the kitchen or living room he spends most of his time in a wheelchair and wheels himself around the house.  Past Medical History  Diagnosis  Date  . Diabetes mellitus without complication   . Stroke   . COPD (chronic obstructive pulmonary disease)     Past Surgical History  Procedure Laterality Date  . Intramedullary (im) nail intertrochanteric Right 2005    [name]     History reviewed. No pertinent family history.  Social History:  reports that he has been smoking Cigarettes.  He has been smoking about 2.00 packs per day. He does not have any smokeless tobacco history on file. He reports that he does not drink alcohol. His drug history is not on file.  Allergies:  Allergies  Allergen Reactions  . Hydrocodone Other (See Comments)    ineffective  . Solarcaine [Benzocaine] Other (See Comments)    Severe burning  . Latex Itching and Rash    Medications:  I have reviewed the patient's current medications. Prior to Admission:  Prescriptions prior to admission  Medication Sig Dispense Refill Last Dose  . clopidogrel (PLAVIX) 75 MG tablet Take 75 mg by mouth daily.   12/12/2014  . gabapentin (NEURONTIN) 300 MG capsule Take 300 mg by mouth 2 (two) times daily.    12/12/2014  . glimepiride (AMARYL) 4 MG tablet Take 4 mg by mouth daily with breakfast.   12/12/2014  . pioglitazone (ACTOS) 45 MG tablet Take 45 mg by mouth at bedtime.    12/12/2014  . sitaGLIPtin (JANUVIA) 100 MG tablet Take 100 mg by mouth at bedtime.    12/12/2014  . donepezil (ARICEPT) 10 MG tablet Take 10 mg by mouth at bedtime.   12/11/2014  Results for orders placed or performed during the hospital encounter of 12/13/14 (from the past 48 hour(s))  Glucose, capillary     Status: None   Collection Time: 12/14/14 12:12 PM  Result Value Ref Range   Glucose-Capillary 72 70 - 99 mg/dL  Glucose, capillary     Status: Abnormal   Collection Time: 12/14/14  5:14 PM  Result Value Ref Range   Glucose-Capillary 156 (H) 70 - 99 mg/dL  Glucose, capillary     Status: None   Collection Time: 12/14/14  9:57 PM  Result Value Ref Range   Glucose-Capillary 90 70 - 99 mg/dL    Comment 1 Documented in Chart    Comment 2 Notify RN   Comprehensive metabolic panel     Status: Abnormal   Collection Time: 12/15/14 12:30 AM  Result Value Ref Range   Sodium 137 135 - 145 mmol/L    Comment: Please note change in reference range.   Potassium 4.4 3.5 - 5.1 mmol/L    Comment: Please note change in reference range. DELTA CHECK NOTED NO VISIBLE HEMOLYSIS    Chloride 109 96 - 112 mEq/L   CO2 24 19 - 32 mmol/L   Glucose, Bld 113 (H) 70 - 99 mg/dL   BUN 21 6 - 23 mg/dL   Creatinine, Ser 1.56 (H) 0.50 - 1.35 mg/dL   Calcium 8.1 (L) 8.4 - 10.5 mg/dL   Total Protein 5.2 (L) 6.0 - 8.3 g/dL   Albumin 2.7 (L) 3.5 - 5.2 g/dL   AST 23 0 - 37 U/L   ALT 12 0 - 53 U/L   Alkaline Phosphatase 59 39 - 117 U/L   Total Bilirubin 0.7 0.3 - 1.2 mg/dL   GFR calc non Af Amer 41 (L) >90 mL/min   GFR calc Af Amer 48 (L) >90 mL/min    Comment: (NOTE) The eGFR has been calculated using the CKD EPI equation. This calculation has not been validated in all clinical situations. eGFR's persistently <90 mL/min signify possible Chronic Kidney Disease.    Anion gap 4 (L) 5 - 15  CBC     Status: Abnormal   Collection Time: 12/15/14 12:30 AM  Result Value Ref Range   WBC 9.2 4.0 - 10.5 K/uL   RBC 3.43 (L) 4.22 - 5.81 MIL/uL   Hemoglobin 10.8 (L) 13.0 - 17.0 g/dL   HCT 34.6 (L) 39.0 - 52.0 %   MCV 100.9 (H) 78.0 - 100.0 fL   MCH 31.5 26.0 - 34.0 pg   MCHC 31.2 30.0 - 36.0 g/dL   RDW 14.9 11.5 - 15.5 %   Platelets 75 (L) 150 - 400 K/uL    Comment: CONSISTENT WITH PREVIOUS RESULT  Glucose, capillary     Status: Abnormal   Collection Time: 12/15/14  7:42 AM  Result Value Ref Range   Glucose-Capillary 117 (H) 70 - 99 mg/dL  Glucose, capillary     Status: Abnormal   Collection Time: 12/15/14 11:42 AM  Result Value Ref Range   Glucose-Capillary 152 (H) 70 - 99 mg/dL  Vit D  25 hydroxy (routine osteoporosis monitoring)     Status: Abnormal   Collection Time: 12/15/14 12:35 PM  Result  Value Ref Range   Vit D, 25-Hydroxy 4.5 (L) 30.0 - 100.0 ng/mL    Comment: (NOTE) Vitamin D deficiency has been defined by the Institute of Medicine and an Endocrine Society practice guideline as a level of serum 25-OH vitamin D less than 20 ng/mL (1,2). The  Endocrine Society went on to further define vitamin D insufficiency as a level between 21 and 29 ng/mL (2). 1. IOM (Institute of Medicine). 2010. Dietary reference   intakes for calcium and D. [place]: The   [employer]. 2. Holick MF, [place], Bischoff-Ferrari HA, et al.   Evaluation, treatment, and prevention of vitamin D   deficiency: an Endocrine Society clinical practice   guideline. JCEM. 2011 Jul; 96(7):1911-30. Performed At: [facility] [place], [place] [id number] Lindon Romp MD [id number]   Glucose, capillary     Status: Abnormal   Collection Time: 12/15/14  4:54 PM  Result Value Ref Range   Glucose-Capillary 122 (H) 70 - 99 mg/dL  Glucose, capillary     Status: Abnormal   Collection Time: 12/15/14 10:07 PM  Result Value Ref Range   Glucose-Capillary 199 (H) 70 - 99 mg/dL   Comment 1 Documented in Chart    Comment 2 Notify RN   CBC     Status: Abnormal   Collection Time: 12/16/14  3:41 AM  Result Value Ref Range   WBC 7.2 4.0 - 10.5 K/uL   RBC 3.11 (L) 4.22 - 5.81 MIL/uL   Hemoglobin 9.8 (L) 13.0 - 17.0 g/dL   HCT 30.9 (L) 39.0 - 52.0 %   MCV 99.4 78.0 - 100.0 fL   MCH 31.5 26.0 - 34.0 pg   MCHC 31.7 30.0 - 36.0 g/dL   RDW 14.8 11.5 - 15.5 %   Platelets 66 (L) 150 - 400 K/uL    Comment: CONSISTENT WITH PREVIOUS RESULT  Glucose, capillary     Status: Abnormal   Collection Time: 12/16/14  8:06 AM  Result Value Ref Range   Glucose-Capillary 158 (H) 70 - 99 mg/dL    Dg Knee Complete 4 Views Right  12/15/2014   CLINICAL DATA:  Right knee pain since a fall several days ago. Acute right acetabular fracture  EXAM: RIGHT KNEE - COMPLETE 4+ VIEW  COMPARISON:   Report of radiographs dated 01/12/2004  FINDINGS: There is diffuse osteopenia. No joint space narrowing. No fracture or dislocation or joint effusion.  IMPRESSION: No acute abnormality.   Electronically Signed   By: Rozetta Nunnery M.D.   On: 12/15/2014 16:20   Dg Abd 2 Views  12/15/2014   CLINICAL DATA:  Left lower quadrant abdominal pain. Acute right hip fracture.  EXAM: ABDOMEN - 2 VIEW  COMPARISON:  CT scan of the pelvis dated 12/13/2014 and radiograph dated 12/13/1998 extend  FINDINGS: Bowel gas pattern is normal. No free air or free fluid. Old compression fracture of L4. Extensive vascular calcification in the abdomen and pelvis. Right acetabular fracture is is quite subtle on the AP view.  IMPRESSION: Benign appearing abdomen except for right acetabular fracture and old L4 fracture.   Electronically Signed   By: Rozetta Nunnery M.D.   On: 12/15/2014 16:24    Review of Systems  Constitutional: Negative for fever and chills.  Respiratory: Negative for shortness of breath.   Cardiovascular: Negative for chest pain and palpitations.  Gastrointestinal: Negative for nausea, vomiting and abdominal pain.  Genitourinary:       + catheter  Musculoskeletal:       Right wrist, hip and knee pain  Neurological: Negative for sensory change.   Blood pressure 140/65, pulse 82, temperature 98.7 F (37.1 C), temperature source Oral, resp. rate 28, height 5' 10.8" (1.798 m), weight 72.167 kg (159 lb 1.6 oz), SpO2 96 %. Physical  Exam  Constitutional: He is cooperative.  Appears older than stated age  Cardiovascular: Normal rate and regular rhythm.   Respiratory:  Clear anterior fields  GI:  Nontender, nondistended,+ bowel sounds  Musculoskeletal:  Pelvis   no traumatic wounds or rash, no ecchymosis, stable to manual stress, nontender. No pain with AP or lateral compression  Right lower extremity Inspection:   No open wounds or lesions, no rash   Old surgical wound noted and is stable Bony eval:    Right hip pain with axial loading. No pain with logrolling. Patient very apprehensive during exam   Knee is also tender with palpation along the joint line   Knee and ankle are stable with evaluation.   No crepitus or gross motion noted with evaluation Soft tissue:   No significant swelling appreciated no wounds or lesions ROM:   Patient demonstrates good active range of motion of his right lower extremity including hip and knee. Several occasions he moved his hip to about 90 when working with therapy. Ankle range of motion is preserved as well. Knee range of motion to 90 without significant difficulty as well Sensation:   DPN, SPN, TN sensory functions grossly intact Motor:   EHL, FHL, anterior tibialis, posterior tibialis, peroneals and gastrocsoleus complex motor function grossly intact Vascular:   + DP pulse   Compartments soft and nontender, no pain with passive stretch    Skin: Skin is warm and intact.    Assessment/Plan:  79 year old male s/p ground level fall  1. Ground level fall  2. Minimally displaced right anterior column acetabular fracture with quadrilateral plate involvement  Plan for nonoperative treatment given appearance on imaging as well as medical comorbidities including nicotine use and diabetes.  Based on current alignment feel that surgery poses more risk given an intrapelvic approach than nonoperative treatment at this time.  The patient also does have pre-existing hip arthritis as well although he denies symptoms   Plan for touchdown weightbearing on his right leg for the next 8 weeks with graduated weightbearing thereafter. Given his ipsilateral right distal radius fracture as well as left upper extremity disability  utilizing a walker will be quite difficult and therefore patient will be bed to chair for the next 8 weeks.   No formal range of motion restrictions of his hip   Will check baseline AP and judet x-rays   PT/OT  Patient will need skilled  nursing at discharge most likely  3. Right knee arthritis  This may limit patient's mobility. Did discuss with the wife that we could perform steroid injection if need be to see if this would help with patient's mobilization. Will monitor patient with therapy over the next several days and proceed accordingly  4. Metabolic bone disease  Patient with profound vitamin D deficiency-  Ref. Range 12/15/2014 12:35  Vit D, 25-Hydroxy Latest Range: 30.0-100.0 ng/mL 4.5 (L)    We'll check additional labs as part of our workup and proceed accordingly   Start vitamin D supplementation: Vitamin D2 50,000 IUs weekly 8 weeks            Vitamin D3 2000 IUs daily + multivitamin   RD consult  5. Nicotine dependence  This is a modifiable risk factor for the development of nonunion and complications. Will discontinue nicotine patch  While the patient is admitted to the inpatient unit he will not receive any nicotine replacements  6. DVT and PE prophylaxis  Patient is currently on heparin  Given anticipated prolonged immobility  he will likely require at least 4 weeks of some type of anticoagulation. Very concerned that he is a fall risk and do not think Coumadin is necessarily the best option for him.  His platelets have steadily trended down since admission starting at 95,000 and down to 66,000 today, check hit panel, continue to monitor  7. Pain management:  Continue with morphine, Oxy IR  Added ultram prn and tylenol PRN  8. Medical issues   Per primary service   9. Activity:  TDWB R Lower Extremity   PT/OT   10. Impediments to fracture healing:  Nicotine dependence  Diabetes  Vitamin d deficiency   Deconditioning   11. Dispo:  Continue with therapies  SNF search    Jari Pigg, PA-C [facility] [phone] (P) 12/16/2014, 9:44 AM

## 2014-12-16 NOTE — Progress Notes (Signed)
OT Cancellation Note  Patient Details Name: Johnathan [name] MRN: [id number] DOB: 09/06/1936   Cancelled Treatment:    Reason Eval/Treat Not Completed: Patient at procedure or test/ unavailable  Earlie RavelingStraub, Avory Mimbs L OTR/L [phone] 12/16/2014, 10:47 AM

## 2014-12-16 NOTE — Progress Notes (Signed)
PROGRESS NOTE  HARVEST STANCO ZOX:[id number] DOB: 11-29-1936 DOA: 12/13/2014 PCP: Kaleen Mask, MD  HPI: Johnathan [name] is a 79 y.o. male with Past medical history of diabetes mellitus, CVA,, history of right femur fracture 2005, history of left wrist drop since childhood, and dementia. Patient presents with complaints of a fall.  The fall was not witnessed but after  hearing the thud his wife came to the room and found the patient sitting on the floor with complaining of pain in his right hand as well as right hip. With this the patient was taken to [facility] and from that he was transferred here for right acetabular fracture.  Subjective / 24 H Interval events Family still weighing surgery vs no surgery.  It appears they are leaning towards no surgery.    Assessment/Plan: Principal Problem:   Acetabular fracture Active Problems:   Femur fracture, right   Right radial fracture   Diabetes mellitus   CVA (cerebral infarction)   Smoker   Suspected clinically COPD (chronic obstructive pulmonary disease)   Dementia   Left wrist drop   Vitamin D deficiency  Right Acetabular fracture/Right anterior iliac spine fracture/Hematoma The patient is presented with a mechanical fall. He is found to have right closed acetabular fracture as well as right anterior inferior iliac spine fracture and soft tissue hematoma. Orthopedic surgery was consulted.  Appreciate Mellody Dance Jaydien's thorough consult note.  Family requests a conversation with the Orthopedic surgeon still weighing surgery vs no surgery.  Per Ortho:  No surgery planned for the hip.  Conservative treatment.  Touch Down Weight Bearing Status.   Right radial fracture.  Admitting MD discussed with Dr. Izora Ribas who recommended splinting the wrist.  Outpatient follow up has been scheduled in [facility] office for next Monday 1/11 at 9:00 am.   Low grade fever Likely due to multiple fractures.  Will continue to monitor.  No antibiotics at  this point.  Acute on Chronic Thrombocytopenia Patient's platelets have dropped from 95k to 66k, patient with history of thrombocytopenia as far back as 2008.  Believe this acute drop is reactive.  Smear sent for review. HIT panel ordered by orthopedic surgery.    Acute on Chronic Anemia  Patient with acute drop hgb.  May be bleeding into his hematoma.  D/C Heparin & place SCDs for prophylaxis. Continue to monitor closely.  Transfuse if needed.  COPD Stable, no wheezing  CKD stage III  Baseline creatinine 1.5-2, as far back as 2008  Creatinine at 1.7 this time, stable  His CKD is likely due to his underlying diabetes  Hyperkalemia Likely in the setting of chronic renal disease.  Resolved with kayexalate.  DM  Continue sliding scale Hemoglobin A1c is 6.0  Respiratory failure with hypoxemia  This is likely acute on chronic, patient has underlying COPD, he is not using oxygen at home. He is breathing comfortably and is in no distress, has no wheezing on exam.  Dementia  Patient had a CVA a few years back and probably has underlying vascular dementia, over the last year or year and a half he has been having a progressive decline in terms of his cognition as well as his functional level, he walks some in the house but mostly stays in a wheelchair.  Prior CVA - stable, no new focal findings  Tobacco abuse  Ortho recommends discontinuation of nicotine patch.  This was discussed with the patient's wife who was opposed to completely discontinuing the nicotine.  A lower dose nicotine patch was ordered.   Diet: Diet Carb Modified Fluids: saline lock. DVT Prophylaxis: SCDs  Code Status: Full Code Family Communication: d/w wife bedside  Disposition Plan: inpatient  Consultants:  Orthopedic surgery   Hand surgery via telephone.  Procedures:  None    Antibiotics  Anti-infectives    None       Studies  Dg Chest 1 View No acute abnormalities of the chest.    Electronically Signed   By: Geanie Cooley M.D.   On: 12/13/2014 18:10   Dg Wrist Complete Right Fracture of distal radius. Probable nondisplaced fracture of distal ulna.   Electronically Signed   By: Sherian Rein M.D.   On: 12/13/2014 18:09   Dg Hip Bilateral W/pelvis Mildly displaced fracture of the medial acetabulum. CT scan is recommended for further evaluation.   Electronically Signed   By: Roque Lias M.D.   On: 12/13/2014 18:14   Ct Head Wo Contrast 1. Multiple remote infarcts as described. 2. Remote lacunar infarcts within the basal ganglia and cerebellum. 3. No acute intracranial abnormality or evidence for acute trauma. 4. Levoconvex curvature of the cervical spine without evidence for acute trauma. 5. Severe emphysematous changes.   Electronically Signed   By: Gennette Pac M.D.   On: 12/13/2014 20:41   Ct Cervical Spine Wo Contrast 12/13/2014 1. Multiple remote infarcts as described. 2. Remote lacunar infarcts within the basal ganglia and cerebellum. 3. No acute intracranial abnormality or evidence for acute trauma. 4. Levoconvex curvature of the cervical spine without evidence for acute trauma. 5. Severe emphysematous changes.   Electronically Signed   By: Gennette Pac M.D.   On: 12/13/2014 20:41   Ct Pelvis Wo Contrast 12/13/2014 1. T-shaped fracture of the right acetabulum; the transverse component is thought to be juxtatectal in nature. The anterior component extends into the anterior wall more inferiorly, with partial disruption at the lateral aspect of the right superior pubic ramus. Minimally displaced fracture of the right inferior pubic ramus. Displacement measures perhaps 5 mm at the transverse component and 3 mm at the anterior component. 2. Avulsion fracture involving the right anterior inferior iliac spine, with mild displacement. 3. Associated mild soft tissue hematoma along the right pelvic sidewall, tracking minimally into the right lower quadrant in a retroperitoneal  distribution. 4. Diffuse calcification along the distal abdominal aorta and its branches. 5. Significant chronic compression deformity of vertebral body L4.   Electronically Signed   By: Roanna Raider M.D.   On: 12/13/2014 21:29   Dg Chest Port 1 View 12/14/2014   CLINICAL DATA:Suggestion of slight atelectasis in the left lung base.   Electronically Signed   By: Burman Nieves M.D.   On: 12/14/2014 04:38   Objective  Filed Vitals:   12/16/14 0506 12/16/14 0526 12/16/14 0819 12/16/14 1320  BP: 105/67 140/65  126/54  Pulse: 101 82  88  Temp: 97.7 F (36.5 C) 98.7 F (37.1 C)  99.3 F (37.4 C)  TempSrc: Oral Oral  Oral  Resp: Height:      Weight:      SpO2: 97% 96% 96% 94%    Intake/Output Summary (Last 24 hours) at 12/16/14 1604 Last data filed at 12/16/14 1320  Gross per 24 hour  Intake    420 ml  Output   1100 ml  Net   -680 ml   Filed Weights   12/13/14 2345  Weight: 72.167 kg (159 lb 1.6  oz)   Exam:  General:  NAD, pleasant, Awake, Alert, Wife at bedside. Oxygen in place.  HEENT: no scleral icterus, MMM  Cardiovascular: RRR without murmurs.    Respiratory: CTA biL, no increased work of breathing.  Abdomen: soft, non tender  Non distended, +bs.  MSK/Extremities: no cyanosis, warm.  Right arm bandaged in gauze.  Data Reviewed: Basic Metabolic Panel:  Recent Labs Lab 12/13/14 1640 12/14/14 0030 12/14/14 0601 12/15/14 0030  NA 140  --  140 137  K 5.2*  --  5.3* 4.4  CL 109  --  111 109  CO2 26  --  26 24  GLUCOSE 189*  --  168* 113*  BUN 20  --  23 21  CREATININE 1.70*  --  1.70* 1.56*  CALCIUM 8.6  --  8.1* 8.1*  MG  --  1.6  --   --    Liver Function Tests:  Recent Labs Lab 12/13/14 1640 12/14/14 0601 12/15/14 0030  AST 18 15 23   ALT 11 11 12   ALKPHOS 69 60 59  BILITOT 0.5 0.4 0.7  PROT 6.5 5.5* 5.2*  ALBUMIN 3.3* 2.9* 2.7*   CBC:  Recent Labs Lab 12/13/14 1640 12/14/14 0601 12/15/14 0030 12/16/14 0341  WBC 8.1 9.0 9.2  7.2  NEUTROABS 6.8  --   --   --   HGB 12.2* 10.9* 10.8* 9.8*  HCT 36.9* 35.2* 34.6* 30.9*  MCV 101.1* 101.7* 100.9* 99.4  PLT 95* 89* 75* 66*   CBG:  Recent Labs Lab 12/15/14 1142 12/15/14 1654 12/15/14 2207 12/16/14 0806 12/16/14 1155  GLUCAP 152* 122* 199* 158* 137*   Scheduled Meds: . cholecalciferol  2,000 Units Oral Daily  . donepezil  10 mg Oral QHS  . feeding supplement (ENSURE COMPLETE)  237 mL Oral BID BM  . gabapentin  300 mg Oral BID  . insulin aspart  0-15 Units Subcutaneous TID WC  . insulin aspart  0-5 Units Subcutaneous QHS  . mometasone-formoterol  2 puff Inhalation BID  . multivitamin with minerals  1 tablet Oral Daily  . tiotropium  18 mcg Inhalation Daily  . Vitamin D (Ergocalciferol)  50,000 Units Oral Q7 days   Continuous Infusions:   Algis DownsMarianne York, [place] JerseyPA-C [facility] Pager [phone]. If 7 PM - 7 AM, please contact night-coverage at [email or web address], password [email or web address] 12/16/2014, 4:04 PM  LOS: 3 days   Patient was seen, examined,treatment plan was discussed with the Advance Practice Provider.  I have directly reviewed the clinical findings, lab, imaging studies and management of this patient in detail. I have made the necessary changes to the above noted documentation, and agree with the documentation, as recorded by the Advance Practice Provider.   Pamella Pertostin Davi Kroon, MD [facility] [phone]

## 2014-12-16 NOTE — Progress Notes (Signed)
Utilization review completed. Wells Mabe, RN, BSN. 

## 2014-12-17 ENCOUNTER — Inpatient Hospital Stay (HOSPITAL_COMMUNITY): Payer: Medicare Other

## 2014-12-17 DIAGNOSIS — W19XXXA Unspecified fall, initial encounter: Secondary | ICD-10-CM | POA: Insufficient documentation

## 2014-12-17 DIAGNOSIS — E559 Vitamin D deficiency, unspecified: Secondary | ICD-10-CM

## 2014-12-17 LAB — CBC
HEMATOCRIT: 31.1 % — AB (ref 39.0–52.0)
HEMOGLOBIN: 9.9 g/dL — AB (ref 13.0–17.0)
MCH: 31.5 pg (ref 26.0–34.0)
MCHC: 31.8 g/dL (ref 30.0–36.0)
MCV: 99 fL (ref 78.0–100.0)
Platelets: 81 10*3/uL — ABNORMAL LOW (ref 150–400)
RBC: 3.14 MIL/uL — ABNORMAL LOW (ref 4.22–5.81)
RDW: 14.6 % (ref 11.5–15.5)
WBC: 5.8 10*3/uL (ref 4.0–10.5)

## 2014-12-17 LAB — FERRITIN: Ferritin: 138 ng/mL (ref 22–322)

## 2014-12-17 LAB — TESTOSTERONE, FREE: TESTOSTERONE FREE: 4.3 pg/mL — AB (ref 47.0–244.0)

## 2014-12-17 LAB — BASIC METABOLIC PANEL
ANION GAP: 5 (ref 5–15)
BUN: 17 mg/dL (ref 6–23)
CO2: 27 mmol/L (ref 19–32)
Calcium: 7.9 mg/dL — ABNORMAL LOW (ref 8.4–10.5)
Chloride: 103 mEq/L (ref 96–112)
Creatinine, Ser: 1.24 mg/dL (ref 0.50–1.35)
GFR calc Af Amer: 63 mL/min — ABNORMAL LOW (ref >90)
GFR calc non Af Amer: 55 mL/min — ABNORMAL LOW (ref >90)
Glucose, Bld: 218 mg/dL — ABNORMAL HIGH (ref 70–99)
Potassium: 4.4 mmol/L (ref 3.5–5.1)
SODIUM: 135 mmol/L (ref 135–145)

## 2014-12-17 LAB — IRON AND TIBC
Iron: 19 ug/dL — ABNORMAL LOW (ref 42–165)
Saturation Ratios: 9 % — ABNORMAL LOW (ref 20–55)
TIBC: 205 ug/dL — ABNORMAL LOW (ref 215–435)
UIBC: 186 ug/dL (ref 125–400)

## 2014-12-17 LAB — RETICULOCYTES
RBC.: 3.14 MIL/uL — ABNORMAL LOW (ref 4.22–5.81)
Retic Count, Absolute: 28.3 10*3/uL (ref 19.0–186.0)
Retic Ct Pct: 0.9 % (ref 0.4–3.1)

## 2014-12-17 LAB — TSH
TSH: 5.235 u[IU]/mL — ABNORMAL HIGH (ref 0.350–4.500)
TSH: 6.576 u[IU]/mL — AB (ref 0.350–4.500)

## 2014-12-17 LAB — GLUCOSE, CAPILLARY
Glucose-Capillary: 190 mg/dL — ABNORMAL HIGH (ref 70–99)
Glucose-Capillary: 162 mg/dL — ABNORMAL HIGH (ref 70–99)
Glucose-Capillary: 158 mg/dL — ABNORMAL HIGH (ref 70–99)
Glucose-Capillary: 148 mg/dL — ABNORMAL HIGH (ref 70–99)

## 2014-12-17 LAB — TESTOSTERONE, % FREE: TESTOSTERONE-% FREE: 1 % — AB (ref 1.6–2.9)

## 2014-12-17 LAB — HEMOGLOBIN A1C
HEMOGLOBIN A1C: 5.9 % — AB (ref <5.7)
Mean Plasma Glucose: 123 mg/dL — ABNORMAL HIGH (ref <117)

## 2014-12-17 LAB — FOLATE: FOLATE: 4.1 ng/mL

## 2014-12-17 LAB — TESTOSTERONE: Testosterone: 43 ng/dL — ABNORMAL LOW (ref 300–890)

## 2014-12-17 LAB — PHOSPHORUS: Phosphorus: 2.9 mg/dL (ref 2.3–4.6)

## 2014-12-17 LAB — PREALBUMIN: Prealbumin: 10 mg/dL — ABNORMAL LOW (ref 17.0–34.0)

## 2014-12-17 LAB — T4, FREE: Free T4: 1.29 ng/dL (ref 0.80–1.80)

## 2014-12-17 LAB — SEX HORMONE BINDING GLOBULIN: Sex Hormone Binding: 77 nmol/L (ref 22–77)

## 2014-12-17 LAB — VITAMIN B12: Vitamin B-12: 280 pg/mL (ref 211–911)

## 2014-12-17 MED ORDER — CLOPIDOGREL BISULFATE 75 MG PO TABS
75.0000 mg | ORAL_TABLET | Freq: Every day | ORAL | Status: DC
  Administered 2014-12-17 – 2014-12-18 (×2): 75 mg via ORAL
  Filled 2014-12-17 (×3): qty 1

## 2014-12-17 MED ORDER — ENOXAPARIN SODIUM 40 MG/0.4ML ~~LOC~~ SOLN: 40.0000 mg | SUBCUTANEOUS | Status: DC

## 2014-12-17 NOTE — Progress Notes (Signed)
Occupational Therapy Evaluation Patient Details Name: Johnathan [name] MRN: [id number] DOB: 1936/09/12 Today's Date: 12/17/2014    History of Present Illness Pt is a 79 y.o. male adm due to fall resulting in R acetabulum fracture and Rt distal radius fx. From ortho standpoint pt is being treated non surgically. pt with hx of Lt wrist drop and dementia.    Clinical Impression   PTA pt lived at home with wife who assisted pt with ADLs. Pt was able to ambulate short distances, but was in w/c for most of the day. Pt with hx of multiple falls and limited by severe pain with any movement today. Pt requires total (A) for all ADLs and will benefit from SNF at d/c. Pt/wife report that he is scheduled to d/c to [facility] tomorrow and all further OT needs will be met at next venue of care.     Follow Up Recommendations  SNF;Supervision/Assistance - 24 hour    Equipment Recommendations  Other (comment) (Defer to SNF)    Recommendations for Other Services       Precautions / Restrictions Precautions Precautions: Fall Restrictions Weight Bearing Restrictions: Yes RUE Weight Bearing: Weight bear through elbow only (per ortho PA) RLE Weight Bearing: Touchdown weight bearing      Mobility Bed Mobility Overal bed mobility: Needs Assistance;+2 for physical assistance Bed Mobility: Rolling Rolling: Total assist         General bed mobility comments: Pt requires increased time for bed mobility due to anxiety regarding pain. Required total (A) to roll to Left side for RN to assess pt's back (no hematoma noted). Positioned pt in sidelying with pillows for comfort.   Transfers                 General transfer comment: will require lift at this time; unable to assess due to pain     Balance Overall balance assessment: History of Falls                                          ADL Overall ADL's : Needs assistance/impaired                                        General ADL Comments: Pt is total (A) for all ADLs at baseline per wife. Pt requires increased level of assist due to decreased mobility and high pain with movement. Pt crying out with rolling in bed and moves RLE quickly when pain strikes.      Vision  Pt wears glasses and has no apparent visual deficits.                    Perception Perception Perception Tested?: No   Praxis Praxis Praxis tested?: Within functional limits    Pertinent Vitals/Pain Pain Assessment: Faces Faces Pain Scale: Hurts whole lot Pain Location: Rt Hip with movement Pain Descriptors / Indicators: Grimacing;Guarding Pain Intervention(s): Limited activity within patient's tolerance;Monitored during session;Repositioned     Hand Dominance     Extremity/Trunk Assessment Upper Extremity Assessment Upper Extremity Assessment: RUE deficits/detail RUE Deficits / Details: Pt in splint from MCPs (stockinette over digits) to distal humerus. Pt able to wiggle all digits and perform flexion ~1/2 ROM with full extension. No c/o pain in RUE. Edema noted and repositioned for  edema control. Pt wife reports previous R shoulder injury (fall on ice) and pt with ~3/4 ROM shoulder flexion.  RUE: Unable to fully assess due to pain;Unable to fully assess due to immobilization RUE Coordination: decreased fine motor;decreased gross motor   Lower Extremity Assessment Lower Extremity Assessment: Defer to PT evaluation   Cervical / Trunk Assessment Cervical / Trunk Assessment: Normal   Communication Communication Communication: No difficulties   Cognition Arousal/Alertness: Awake/alert Behavior During Therapy: Anxious Overall Cognitive Status: History of cognitive impairments - at baseline Area of Impairment: Problem solving;Safety/judgement;Following commands;Memory     Memory: Decreased recall of precautions;Decreased short-term memory Following Commands: Follows one step commands with increased  time Safety/Judgement: Decreased awareness of safety   Problem Solving: Decreased initiation;Slow processing;Requires tactile cues;Requires verbal cues        Exercises Exercises: General Upper Extremity Other Exercises Other Exercises: educated on importance of Rt UE elevation for edema control        Home Living Family/patient expects to be discharged to:: Skilled nursing facility Living Arrangements: Spouse/significant other                               Additional Comments: Pt plans to d/c to [facility]      Prior Functioning/Environment Level of Independence: Needs assistance  Gait / Transfers Assistance Needed: per wife, pt in wheelchair predominately but was able to ambulate short household distance as needed ADL's / Homemaking Assistance Needed: total (A )        OT Diagnosis: Generalized weakness;Acute pain    End of Session  Activity Tolerance: Patient limited by pain Patient left: in bed;with call bell/phone within reach;with family/visitor present   Time: 9983-3825 OT Time Calculation (min): 28 min Charges:  OT General Charges $OT Visit: 1 Procedure OT Treatments $Self Care/Home Management : 8-22 mins G-Codes:    Juluis Rainier 2015-01-09, 11:39 AM  Cyndie Chime, OTR/L Occupational Therapist [phone] (pager)

## 2014-12-17 NOTE — Progress Notes (Signed)
PROGRESS NOTE  Johnathan [name] ZOX:[id number] DOB: 25-Jul-1936 DOA: 12/13/2014 PCP: Johnathan Mask, MD  HPI: Johnathan [name] is a 79 y.o. male with Past medical history of diabetes mellitus, CVA,, history of right femur fracture 2005, history of left wrist drop since childhood, and dementia. Patient presents with complaints of a fall.  The fall was not witnessed but after  hearing the thud his wife came to the room and found the patient sitting on the floor with complaining of pain in his right hand as well as right hip. With this the patient was taken to [facility] and from that he was transferred here for right acetabular fracture.  Subjective / 24 H Interval events Family and Ortho have decided no surgery.  Patient in severe pain with any movement.  Family has noticed coughing with all liquid intake.  Patient reports he has developed a cough.    Assessment/Plan: Principal Problem:   Acetabular fracture Active Problems:   Femur fracture, right   Right radial fracture   Diabetes mellitus   CVA (cerebral infarction)   Smoker   Suspected clinically COPD (chronic obstructive pulmonary disease)   Dementia   Left wrist drop   Vitamin D deficiency  Right Acetabular fracture/Right anterior iliac spine fracture/Hematoma The patient is presented with a mechanical fall. He has a right closed acetabular fracture as well as right anterior inferior iliac spine fracture and soft tissue hematoma. Orthopedic surgery was consulted.  Appreciate Johnathan [name]'s thorough consult note.  Per Ortho:  No surgery planned for the hip.  Conservative treatment.  Touch Down Weight Bearing Status.  Follow up with Ortho outpatient in 2 weeks. Several metabolic bone health labs pending which Ortho will follow.  Currently Johnathan [name]' pain is uncontrolled when he moves.  We will adjust his pain medications.  Right radial fracture.  Admitting MD discussed with Johnathan [name] who recommended splinting the wrist.   Outpatient follow up has been scheduled in [facility] office for next Monday 1/11 at 9:00 am.   Low grade fever Patient has continued to have a low grade fever since admission.  Initially this was presumed to be due to acute fractures, but it has persisted.  CXR negative, U/A negative.   This could be from atelectasis or reabsorption of his hematoma.  Vitamin D Deficiency Vit D, 25 hydroxy = 4.5.  Ortho has started Vit D supplementation.    Question of Aspiration Patient coughing on liquids.  Sounded wet on auscultation.  CXR negative.  Have requested Speech Evaluation.  Acute on Chronic Thrombocytopenia Patient's platelets have dropped from 95k to 66k, but fortunately are now trending back up. Patient with a history of thrombocytopenia as far back as 2008.  Believe this acute drop is reactive.  Smear sent for review.  HIT panel ordered by orthopedic surgery.    Acute on Chronic Anemia  Patient with acute drop hgb.  Was initially concerned patient may be bleeding into his hematoma.  D/C'd Heparin & place SCDs for prophylaxis.  On 1/6 hgb is stable.  Will continue to monitor.   Restarting plavix 1/6.    COPD Stable, no wheezing  CKD stage III  Baseline creatinine 1.5-2, as far back as 2008  Creatinine at 1.24 this time, stable  His CKD is likely due to his underlying diabetes  Hyperkalemia Likely in the setting of chronic renal disease.  Resolved with kayexalate.  DM  Continue sliding scale Hemoglobin A1c is 6.0  Respiratory failure with hypoxemia  This is likely acute on chronic, patient has underlying COPD, he is not using oxygen at home. He is breathing comfortably and is in no distress, has no wheezing on exam.  Dementia  Patient had a CVA a few years back and probably has underlying vascular dementia, over the last year or year and a half he has been having a progressive decline in terms of his cognition as well as his functional level, he walks some in the house but mostly  stays in a wheelchair.  Prior CVA - stable, no new focal findings.  Will resume plavix 1/6  Tobacco abuse  Ortho recommends discontinuation of nicotine patch.  This was discussed with the patient's wife who was opposed to completely discontinuing the nicotine.  A lower dose nicotine patch was ordered. Subsequently ortho discontinued the nicotine patch.   Diet: Diet Carb Modified Fluids: saline lock. DVT Prophylaxis: SCDs  Code Status: Full Code Family Communication: d/w wife bedside  Disposition Plan: inpatient  Consultants:  Orthopedic surgery   Hand surgery via telephone.  Procedures:  None    Antibiotics  Anti-infectives    None       Studies  Dg Chest 1 View No acute abnormalities of the chest.   Electronically Signed   By: Johnathan [name] M.D.   On: 12/13/2014 18:10   Dg Wrist Complete Right Fracture of distal radius. Probable nondisplaced fracture of distal ulna.   Electronically Signed   By: Johnathan [name] M.D.   On: 12/13/2014 18:09   Dg Hip Bilateral W/pelvis Mildly displaced fracture of the medial acetabulum. CT scan is recommended for further evaluation.   Electronically Signed   By: Johnathan [name] M.D.   On: 12/13/2014 18:14   Ct Head Wo Contrast 1. Multiple remote infarcts as described. 2. Remote lacunar infarcts within the basal ganglia and cerebellum. 3. No acute intracranial abnormality or evidence for acute trauma. 4. Levoconvex curvature of the cervical spine without evidence for acute trauma. 5. Severe emphysematous changes.   Electronically Signed   By: Johnathan [name] M.D.   On: 12/13/2014 20:41   Ct Cervical Spine Wo Contrast 12/13/2014 1. Multiple remote infarcts as described. 2. Remote lacunar infarcts within the basal ganglia and cerebellum. 3. No acute intracranial abnormality or evidence for acute trauma. 4. Levoconvex curvature of the cervical spine without evidence for acute trauma. 5. Severe emphysematous changes.   Electronically Signed   By: Johnathan [name] M.D.   On: 12/13/2014 20:41   Ct Pelvis Wo Contrast 12/13/2014 1. T-shaped fracture of the right acetabulum; the transverse component is thought to be juxtatectal in nature. The anterior component extends into the anterior wall more inferiorly, with partial disruption at the lateral aspect of the right superior pubic ramus. Minimally displaced fracture of the right inferior pubic ramus. Displacement measures perhaps 5 mm at the transverse component and 3 mm at the anterior component. 2. Avulsion fracture involving the right anterior inferior iliac spine, with mild displacement. 3. Associated mild soft tissue hematoma along the right pelvic sidewall, tracking minimally into the right lower quadrant in a retroperitoneal distribution. 4. Diffuse calcification along the distal abdominal aorta and its branches. 5. Significant chronic compression deformity of vertebral body L4.   Electronically Signed   By: Roanna Raider M.D.   On: 12/13/2014 21:29   Dg Chest Port 1 View 12/14/2014   CLINICAL DATA:Suggestion of slight atelectasis in the left lung base.   Electronically Signed   By: Marisa Cyphers.D.  On: 12/14/2014 04:38   Objective  Filed Vitals:   12/17/14 0400 12/17/14 0656 12/17/14 0800 12/17/14 0942  BP:  137/50    Pulse:  78    Temp:  100.2 F (37.9 C)    TempSrc:  Oral    Resp: Height:      Weight:      SpO2: 96% 96%  97%    Intake/Output Summary (Last 24 hours) at 12/17/14 1030 Last data filed at 12/17/14 1026  Gross per 24 hour  Intake    880 ml  Output   1000 ml  Net   -120 ml   Filed Weights   12/13/14 2345  Weight: 72.167 kg (159 lb 1.6 oz)   Exam:  General:  NAD, pleasant, Awake, Alert, Wife at bedside. Oxygen in place.  HEENT: no scleral icterus, MMM  Cardiovascular: RRR without murmurs.    Respiratory: CTA biL, no increased work of breathing.  Abdomen: soft, non tender  Non distended, +bs.  MSK/Extremities: no cyanosis, warm.  Right arm  bandaged in gauze.  Data Reviewed: Basic Metabolic Panel:  Recent Labs Lab 12/13/14 1640 12/14/14 0030 12/14/14 0601 12/15/14 0030 12/17/14 0643  NA 140  --  140 137 135  K 5.2*  --  5.3* 4.4 4.4  CL 109  --  111 109 103  CO2 26  --  GLUCOSE 189*  --  168* 113* 218*  BUN 20  --  CREATININE 1.70*  --  1.70* 1.56* 1.24  CALCIUM 8.6  --  8.1* 8.1* 7.9*  MG  --  1.6  --   --   --   PHOS  --   --   --   --  2.9   Liver Function Tests:  Recent Labs Lab 12/13/14 1640 12/14/14 0601 12/15/14 0030  AST ALT ALKPHOS 69 60 59  BILITOT 0.5 0.4 0.7  PROT 6.5 5.5* 5.2*  ALBUMIN 3.3* 2.9* 2.7*   CBC:  Recent Labs Lab 12/13/14 1640 12/14/14 0601 12/15/14 0030 12/16/14 0341 12/17/14 0643  WBC 8.1 9.0 9.2 7.2 5.8  NEUTROABS 6.8  --   --   --   --   HGB 12.2* 10.9* 10.8* 9.8* 9.9*  HCT 36.9* 35.2* 34.6* 30.9* 31.1*  MCV 101.1* 101.7* 100.9* 99.4 99.0  PLT 95* 89* 75* 66* 81*   CBG:  Recent Labs Lab 12/16/14 0806 12/16/14 1155 12/16/14 1700 12/16/14 2111 12/17/14 0801  GLUCAP 158* 137* 169* 209* 190*   Scheduled Meds: . cholecalciferol  2,000 Units Oral Daily  . donepezil  10 mg Oral QHS  . feeding supplement (ENSURE COMPLETE)  237 mL Oral BID BM  . gabapentin  300 mg Oral BID  . insulin aspart  0-15 Units Subcutaneous TID WC  . insulin aspart  0-5 Units Subcutaneous QHS  . mometasone-formoterol  2 puff Inhalation BID  . multivitamin with minerals  1 tablet Oral Daily  . tiotropium  18 mcg Inhalation Daily  . Vitamin D (Ergocalciferol)  50,000 Units Oral Q7 days   Continuous Infusions:   Algis Downs, [place] [facility] Pager [phone]. If 7 PM - 7 AM, please contact night-coverage at [email or web address], password [facility] 12/17/2014, 10:30 AM  LOS: 4 days    I have seen and evaluated this patient and agree with the PA notes. Patient is frail, with not pain at rest, with significant pain with  minimal movement, reluctant to  take po pain meds. Encourage patient to take prn pain meds prior to physical therapy. Advised adequate stool regimen.  Family at bedside, reported patient has baseline short term memory impairment.  Likely d/c to snf tomorrow.

## 2014-12-17 NOTE — Clinical Social Work Note (Signed)
Bed offers given. Family chooses [facility].   Roddie McBryant Admiral Marcucci MSW, Monroe[place]CSWA, [place], [phone]

## 2014-12-17 NOTE — Progress Notes (Signed)
Orthopaedic Trauma Service Progress Note  Subjective  No interval change Ortho issues stable   Review of Systems  Constitutional: Negative for fever and chills.  Respiratory: Negative for shortness of breath and wheezing.   Cardiovascular: Negative for chest pain and palpitations.  Gastrointestinal: Negative for nausea and vomiting.  Musculoskeletal:       R hip and wrist pain   Neurological: Negative for tingling and sensory change.     Objective   BP 137/50 mmHg  Pulse 78  Temp(Src) 100.2 F (37.9 C) (Oral)  Resp 19  Ht 5' 10.8" (1.798 m)  Wt 72.167 kg (159 lb 1.6 oz)  BMI 22.32 kg/m2  SpO2 97%  Intake/Output      01/05 0701 - 01/06 0700 01/06 0701 - 01/07 0700   P.O. 520 240   Total Intake(mL/kg) 520 (7.2) 240 (3.3)   Urine (mL/kg/hr) 1000 (0.6)    Total Output 1000     Net -480 +240          Labs   Results for Johnathan, [name] (MRN [id number]) as of 12/17/2014 10:12  Ref. Range 12/17/2014 06:43  TSH Latest Range: 0.350-4.500 uIU/mL 5.235 (H)  Results for Johnathan, [name] (MRN [id number]) as of 12/17/2014 10:12  Ref. Range 12/17/2014 06:43  WBC Latest Range: 4.0-10.5 K/uL 5.8  RBC Latest Range: 4.22-5.81 MIL/uL 3.14 (L)  Hemoglobin Latest Range: 13.0-17.0 g/dL 9.9 (L)  HCT Latest Range: 39.0-52.0 % 31.1 (L)  MCV Latest Range: 78.0-100.0 fL 99.0  MCH Latest Range: 26.0-34.0 pg 31.5  MCHC Latest Range: 30.0-36.0 g/dL 78.2  RDW Latest Range: 11.5-15.5 % 14.6  Platelets Latest Range: 150-400 K/uL 81 (L)   Additional labs pending   Exam  Gen: resting in bed  Pelvis: exam stable  Ext:      Right upper extremity: splinted, per Hand Surgery      Right Lower Extremity   Distal motor and sensory functions grossly intact  Ext warm   + DP pulse  No new acute findings    Assessment and Plan   POD/HD#: 60   79 year old male s/p ground level fall  1. Ground level fall  2. Minimally displaced right anterior column acetabular fracture with quadrilateral plate  involvement             continue with non-op plan  TDWB x 8 weeks              No formal range of motion restrictions of his hip   Baseline xrays stable. Bone appears very osteoporotic on xray               PT/OT             SNF   3. Right knee arthritis             continue to monitor   4. Metabolic bone disease             Patient with profound vitamin D deficiency              elevated TSH, will order T3 and T4      Additional labs pending               vitamin D supplementation: Vitamin D2 50,000 IUs weekly 8 weeks  Vitamin D3 2000 IUs daily + multivitamin   Suspect pt may be a candidate for forteo but will wait for all labs to return before starting this               RD consult  5. Nicotine dependence             This is a modifiable risk factor for the development of nonunion and complications. Will discontinue nicotine patch             While the patient is admitted to the inpatient unit he will not receive any nicotine replacements  Strongly advocate against the use of any nicotine products at dc as well   6. DVT and PE prophylaxis            plts trended up today   Recommend dc with lovenox at 40 mg sq daily x 4 weeks   7. Pain management:             Continue with morphine, Oxy IR              ultram prn and tylenol PRN  8. Medical issues               Per primary service   9. Activity:             TDWB R Lower Extremity               PT/OT   10. Impediments to fracture healing:             Nicotine dependence             Diabetes             Vitamin d deficiency               Deconditioning   11. Dispo:             SNF when bed available   Follow up with ortho in 2 weeks  Will follow up on labs as well    Mearl LatinKeith W. Dream, PA-C [facility] [phone](P) [phone] (O) 12/17/2014 10:11 AM

## 2014-12-17 NOTE — Progress Notes (Addendum)
NUTRITION FOLLOW UP  Intervention:   -Continue Ensure Complete po BID, each supplement provides 350 kcal and 13 grams of protein -Magic Cup TID  Nutrition Dx:   Predicted suboptimal nutrient intake related to decreased appetite as evidenced by PO: 50%; ongoing  Goal:   Pt will meet >90% of estimated nutritional needs; not met  Monitor:   PO/supplement intake, labs, weight changes, I/O's  Assessment:   Johnathan [name] is a 79 y.o. male with Past medical history of diabetes mellitus, CVA,, history of right femur fracture 2005, history of left wrist drop since childhood, and dementia. Patient presents with complaints of a fall. The fall was not witnessed but after hearing the thud his wife came to the room and found the patient sitting on the floor with complaining of pain in his right hand as well as right hip. With this the patient was taken to [facility] and from that he was transferred here for right acetabular fracture.  RD received consult to assess nutrient needs. Per consult order, pt with profound vitamin D deficiency and has been started on supplementation. Hx mainly obtained from pt wife at bedside, as pt was drowsy at time of visit. Deferred exam due to pt with multiple bandages and drowsiness.  Wife reports a poor appetite over the past year. She estimated that pt has lost about 6-8# 1 year ago and confirms UBW of 165#. She reports that pt had a stroke in 2008 and required a pureed diet with thickened liquids due to dysphagia, but he was able to eat regular foods 3 weeks after discharge from rehab. He had no dietary restrictions PTA. 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 Pt reports that his tastes for food have changed and often vary from one day to the next. Noted salad from lunch was untouched.  Pt is receiving Ensure supplements BID. Wife reports that pt consumed approximately 75% of bottle last night. However, today he has taken only a few sips of it. He  has tried Glucerna in the past, but he does not like it.  Pt wife is very interested in providing supplements with the most caloric density. She is amenable to try Magic Cups along with Boost. RD to order.  Discharge disposition is to [facility] for rehab tomorrow, per wife's report. She was very grateful for RD visit.  Labs reviewed. Calcium: 7.9, Glucose: 218, CBGS: 162-109.   Height: Ht Readings from Last 1 Encounters:  12/13/14 5' 10.8" (1.798 m)    Weight Status:   Wt Readings from Last 1 Encounters:  12/13/14 159 lb 1.6 oz (72.167 kg)    Re-estimated needs:  Kcal: 2000-2200 Protein: 86-96 grams Fluid: 2.0-2.2 L  Skin: Intact  Diet Order: Diet Carb Modified   Intake/Output Summary (Last 24 hours) at 12/17/14 1358 Last data filed at 12/17/14 1026  Gross per 24 hour  Intake    640 ml  Output    400 ml  Net    240 ml    Last BM: 12/15/14   Labs:   Recent Labs Lab 12/14/14 0030 12/14/14 0601 12/15/14 0030 12/17/14 0643  NA  --  140 137 135  K  --  5.3* 4.4 4.4  CL  --  111 109 103  CO2  --  _0 BUN  --  _1 CREATININE  --  1.70* 1.56* 1.24  CALCIUM  --  8.1* 8.1* 7.9*  MG 1.6  --   --   --  PHOS  --   --   --  2.9  GLUCOSE  --  168* 113* 218*    CBG (last 3)   Recent Labs  12/16/14 2111 12/17/14 0801 12/17/14 1208  GLUCAP 209* 190* 162*    Scheduled Meds: . cholecalciferol  2,000 Units Oral Daily  . clopidogrel  75 mg Oral Q breakfast  . donepezil  10 mg Oral QHS  . feeding supplement (ENSURE COMPLETE)  237 mL Oral BID BM  . gabapentin  300 mg Oral BID  . insulin aspart  0-15 Units Subcutaneous TID WC  . insulin aspart  0-5 Units Subcutaneous QHS  . mometasone-formoterol  2 puff Inhalation BID  . multivitamin with minerals  1 tablet Oral Daily  . tiotropium  18 mcg Inhalation Daily  . Vitamin D (Ergocalciferol)  50,000 Units Oral Q7 days    Continuous Infusions:   Soffia Doshier A. Jimmye Norman, RD, LDN, CDE Pager: [phone] After  hours Pager: [phone]

## 2014-12-17 NOTE — Evaluation (Signed)
Clinical/Bedside Swallow Evaluation Patient Details  Name: Johnathan [name] MRN: [id number] Date of Birth: Apr 21, 1936  Today's Date: 12/17/2014 Time: 4098-1191 SLP Time Calculation (min) (ACUTE ONLY): 15 min  Past Medical History:  Past Medical History  Diagnosis Date  . Diabetes mellitus without complication   . Stroke   . COPD (chronic obstructive pulmonary disease)    Past Surgical History:  Past Surgical History  Procedure Laterality Date  . Intramedullary (im) nail intertrochanteric Right 2005    [name]    HPI:  Johnathan [name] is a 79 year old male with past medical history of diabetes mellitus, CVA, history of right femur fracture 2005, history of left wrist drop since childhood, and dementia. Patient presents with complaints of a fall with right acetabular fracture and family report of intermittent overt s/s of aspiration with PO.     Assessment / Plan / Recommendation Clinical Impression  Patient presents with functional swallow and no observed s/s of aspiration despite family report of them during PO intake.  Given that patient's intake was limited today SLP focused on addressing family education with recommendations to ensure upright positioning, slow pacing as patient is a dependent feeder and to ensure he is not taking consecutive straw sips.  Family verbalized understanding of information and brief SLP follow up is recommended to ensure the effectiveness of recommended strategies.      Aspiration Risk  Mild    Diet Recommendation Regular;Thin liquid   Liquid Administration via: Straw Medication Administration: Whole meds with puree Supervision: Patient able to self feed;Full supervision/cueing for compensatory strategies Compensations: Slow rate;Small sips/bites Postural Changes and/or Swallow Maneuvers: Seated upright 90 degrees    Other  Recommendations Oral Care Recommendations: Oral care BID   Follow Up Recommendations  Skilled Nursing facility    Frequency and  Duration min 2x/week  1 week   Pertinent Vitals/Pain None until repositioned in bed, then once settled pain resolved    SLP Swallow Goals  See care plan for details   Swallow Study Prior Functional Status   Regular and thin per family report     General HPI: Johnathan [name] is a 79 year old male with past medical history of diabetes mellitus, CVA, history of right femur fracture 2005, history of left wrist drop since childhood, and dementia. Patient presents with complaints of a fall with right acetabular fracture and family report of intermittent overt s/s of aspiration with PO.   Type of Study: Bedside swallow evaluation Previous Swallow Assessment: family reports history of needing thickened liquids after CVA but he was transitioned back to thins Diet Prior to this Study: Regular;Thin liquids Temperature Spikes Noted: No Respiratory Status: Nasal cannula History of Recent Intubation: No Behavior/Cognition: Alert;Cooperative;Confused;Requires cueing Oral Cavity - Dentition: Dentures, top;Dentures, bottom Self-Feeding Abilities: Total assist Patient Positioning: Upright in bed Baseline Vocal Quality: Clear Volitional Cough: Weak Volitional Swallow: Able to elicit    Oral/Motor/Sensory Function Overall Oral Motor/Sensory Function: Impaired Labial ROM: Within Functional Limits Labial Symmetry: Within Functional Limits Labial Strength: Within Functional Limits Labial Sensation: Within Functional Limits Lingual ROM: Within Functional Limits Lingual Symmetry: Abnormal symmetry right Lingual Strength: Within Functional Limits Lingual Sensation: Within Functional Limits Facial ROM: Within Functional Limits Facial Symmetry: Within Functional Limits Facial Strength: Within Functional Limits Facial Sensation: Within Functional Limits Velum: Within Functional Limits Mandible: Within Functional Limits   Ice Chips Ice chips: Not tested   Thin Liquid Thin Liquid: Within functional  limits Presentation: Cup;Straw    Nectar Thick Nectar  Thick Liquid: Not tested   Honey Thick Honey Thick Liquid: Not tested   Puree Puree: Within functional limits Presentation: Spoon   Solid   GO    Solid: Within functional limits Presentation: [name]      Johnathan [name], M.A., CCC-SLP [phone]  [name] 12/17/2014,4:10 PM

## 2014-12-17 NOTE — Discharge Instructions (Addendum)
Ortho Instructions  Touchdown weightbearing Right Leg Nonweightbearing Right wrist Pt will be bed to chair transfers with slide assist or lift  Continue to ice and elevate injured areas if possible  No formal Range of motion restrictions right lower extremity   Please premedicate before attempting PT.

## 2014-12-17 NOTE — Progress Notes (Signed)
Patient's IV NSL is due to be changed at midnight. PA recommended to not change the site  because patient will be D/C tomorrow morning. We will continue to monitor.

## 2014-12-18 DIAGNOSIS — S7291XA Unspecified fracture of right femur, initial encounter for closed fracture: Secondary | ICD-10-CM

## 2014-12-18 LAB — LUTEINIZING HORMONE: LH: 23.4 m[IU]/mL (ref 3.1–34.6)

## 2014-12-18 LAB — HEPARIN INDUCED THROMBOCYTOPENIA PNL
Heparin Induced Plt Ab: NEGATIVE
Patient O.D.: 0.146
UFH HIGH DOSE UFH H: 0 %
UFH Low Dose 0.1 IU/mL: 0 % Release
UFH Low Dose 0.5 IU/mL: 0 % Release
UFH SRA RESULT: NEGATIVE

## 2014-12-18 LAB — GLUCOSE, CAPILLARY
Glucose-Capillary: 147 mg/dL — ABNORMAL HIGH (ref 70–99)
Glucose-Capillary: 157 mg/dL — ABNORMAL HIGH (ref 70–99)

## 2014-12-18 LAB — CALCIUM, IONIZED: Calcium, Ion: 1.17 mmol/L (ref 1.12–1.32)

## 2014-12-18 LAB — URIC ACID: Uric Acid, Serum: 4.9 mg/dL (ref 4.0–7.8)

## 2014-12-18 LAB — TRANSFERRIN: Transferrin: 144 mg/dL — ABNORMAL LOW (ref 188–341)

## 2014-12-18 LAB — TESTOSTERONE: Testosterone: 39 ng/dL — ABNORMAL LOW (ref 300–890)

## 2014-12-18 LAB — VITAMIN D 1,25 DIHYDROXY
VITAMIN D 1, 25 (OH) TOTAL: 9 pg/mL — AB (ref 18–72)
Vitamin D2 1, 25 (OH)2: <8 pg/mL
Vitamin D3 1, 25 (OH)2: 9 pg/mL

## 2014-12-18 LAB — FOLLICLE STIMULATING HORMONE: FSH: 58 m[IU]/mL — ABNORMAL HIGH (ref 1.4–18.1)

## 2014-12-18 LAB — PROLACTIN: Prolactin: 13.4 ng/mL (ref 2.1–17.1)

## 2014-12-18 MED ORDER — ENSURE COMPLETE PO LIQD: 237.0000 mL | Freq: Two times a day (BID) | ORAL | Status: DC

## 2014-12-18 MED ORDER — DOCUSATE SODIUM 100 MG PO CAPS: 100.0000 mg | ORAL_CAPSULE | Freq: Two times a day (BID) | ORAL | Status: DC

## 2014-12-18 MED ORDER — METHOCARBAMOL 500 MG PO TABS: 500.0000 mg | ORAL_TABLET | Freq: Four times a day (QID) | ORAL | Status: DC | PRN

## 2014-12-18 MED ORDER — ALPRAZOLAM 0.25 MG PO TABS: 0.2500 mg | ORAL_TABLET | Freq: Three times a day (TID) | ORAL | Status: DC | PRN

## 2014-12-18 MED ORDER — ACETAMINOPHEN 500 MG PO TABS: 500.0000 mg | ORAL_TABLET | Freq: Four times a day (QID) | ORAL | Status: DC | PRN

## 2014-12-18 MED ORDER — FENTANYL 12 MCG/HR TD PT72: 12.5000 ug | MEDICATED_PATCH | TRANSDERMAL | Status: DC

## 2014-12-18 MED ORDER — VITAMIN D (ERGOCALCIFEROL) 1.25 MG (50000 UNIT) PO CAPS: 50000.0000 [IU] | ORAL_CAPSULE | ORAL | Status: DC

## 2014-12-18 MED ORDER — ADULT MULTIVITAMIN W/MINERALS CH: 1.0000 | ORAL_TABLET | Freq: Every day | ORAL | Status: DC

## 2014-12-18 MED ORDER — ALBUTEROL SULFATE (2.5 MG/3ML) 0.083% IN NEBU: 2.5000 mg | INHALATION_SOLUTION | RESPIRATORY_TRACT | Status: DC | PRN

## 2014-12-18 MED ORDER — FENTANYL 12 MCG/HR TD PT72
12.5000 ug | MEDICATED_PATCH | TRANSDERMAL | Status: DC
  Administered 2014-12-18: 12.5 ug via TRANSDERMAL
  Filled 2014-12-18: qty 1

## 2014-12-18 MED ORDER — MOMETASONE FURO-FORMOTEROL FUM 100-5 MCG/ACT IN AERO: 2.0000 | INHALATION_SPRAY | Freq: Two times a day (BID) | RESPIRATORY_TRACT | Status: AC

## 2014-12-18 MED ORDER — TIOTROPIUM BROMIDE MONOHYDRATE 18 MCG IN CAPS: 18.0000 ug | ORAL_CAPSULE | Freq: Every day | RESPIRATORY_TRACT | Status: AC

## 2014-12-18 MED ORDER — VITAMIN D3 25 MCG (1000 UNIT) PO TABS: 2000.0000 [IU] | ORAL_TABLET | Freq: Every day | ORAL | Status: DC

## 2014-12-18 MED ORDER — ALPRAZOLAM 0.25 MG PO TABS
0.2500 mg | ORAL_TABLET | Freq: Three times a day (TID) | ORAL | Status: DC | PRN
  Administered 2014-12-18: 0.25 mg via ORAL
  Filled 2014-12-18: qty 1

## 2014-12-18 MED ORDER — POLYSACCHARIDE IRON COMPLEX 150 MG PO CAPS: 150.0000 mg | ORAL_CAPSULE | Freq: Every day | ORAL | Status: AC

## 2014-12-18 MED ORDER — BISACODYL 10 MG RE SUPP
10.0000 mg | Freq: Once | RECTAL | Status: AC
  Administered 2014-12-18: 10 mg via RECTAL
  Filled 2014-12-18: qty 1

## 2014-12-18 MED ORDER — FERROUS SULFATE 325 (65 FE) MG PO TABS: 325.0000 mg | ORAL_TABLET | Freq: Two times a day (BID) | ORAL | Status: DC

## 2014-12-18 NOTE — Progress Notes (Signed)
Pt prepared for d/c to SNF. IV d/c'd. Skin intact except as most recently charted. Vitals are stable. Report called to receiving facility. Pt to be transported by ambulance service. 

## 2014-12-18 NOTE — Clinical Social Work Placement (Signed)
Clinical Social Work Department CLINICAL SOCIAL WORK PLACEMENT NOTE 12/18/2014  Patient:  Johnathan [name],Johnathan [name]  Account Number:  [id number] Admit date:  12/13/2014  Clinical Social Worker:  Lavell LusterJOSEPH BRYANT Magdalynn Davilla, LCSWA  Date/time:  12/16/2014 04:43 PM  Clinical Social Work is seeking post-discharge placement for this patient at the following level of care:   SKILLED NURSING   (*CSW will update this form in Epic as items are completed)   12/16/2014  [facility]/family provided with [facility] Department of Clinical Social Work's list of facilities offering this level of care within the geographic area requested by the patient (or if unable, by the patient's family).  12/16/2014  Patient/family informed of their freedom to choose among providers that offer the needed level of care, that participate in Medicare, Medicaid or managed care program needed by the patient, have an available bed and are willing to accept the patient.  12/16/2014  Patient/family informed of [facility]' ownership interest in [facility], as well as of the fact that they are under no obligation to receive care at this facility.  PASARR submitted to EDS on 12/16/2014 PASARR number received on 12/16/2014  FL2 transmitted to all facilities in geographic area requested by pt/family on  12/16/2014 FL2 transmitted to all facilities within larger geographic area on   Patient informed that his/her managed care company has contracts with or will negotiate with  certain facilities, including the following:     Patient/family informed of bed offers received:  12/17/2014 [facility] at [facility] [place] Physician recommends and patient chooses bed at    [facility] [place] [facility]  12/18/2014 Patient to be transferred to facility by Ambulance Patient and family notified of transfer on 12/18/2014 Name of family member notified:  Ova FreshwaterElizabeth  Almendariz  The following physician request were entered in Epic:   Additional Comments:    Per MD patient ready for DC to [facility] [place] [place]. RN, patient, patient's family, and facility notified of DC. RN given number for report. DC packet on chart. [facility] transport requested for patient. CSW signing off.    Roddie McBryant Zebbie Ace MSW, [facility] Star[place]CSWA, [place], [phone]

## 2014-12-18 NOTE — Discharge Summary (Signed)
Physician Discharge Summary  Johnathan [name] ZOX:[id number] DOB: 1936/04/16 DOA: 12/13/2014  PCP: Kaleen Mask, MD  Admit date: 12/13/2014 Discharge date: 12/18/2014  Time spent: 35 minutes  Recommendations for Outpatient Follow-up:  1. Discharge to SNF for PT / OT  2. Follow up with Hand Surgery (Dr. Izora Ribas) on Monday 1/11 3. Follow up with Ortho - for hip fracture (Dr. Carola Frost) in two weeks. 4. Patient with severe pain.  Does not respond well to Oxy.  Fentanyl patch placed. 5. CBC in 2-3 days.  Monitor platelets - thrombocytopenia. 6. Recommend referral to endocrinology - for TSH, Low Testosterone, elevated FSH, Low Vit D. 7. Recommend Palliative Medicine Consultation for Goals of Care.  Discharge Diagnoses:  Principal Problem:   Femur fracture, right Active Problems:   Acetabular fracture   Right radial fracture   Diabetes mellitus   CVA (cerebral infarction)   Smoker   Suspected clinically COPD (chronic obstructive pulmonary disease)   Dementia   Left wrist drop   Vitamin D deficiency   Fall   Discharge Condition: stable  Diet recommendation: carb modified   History of present illness:  Johnathan [name] is a 79 y.o. male with past medical history of diabetes mellitus, CVA,, history of right femur fracture 2005, history of left wrist drop since childhood, and mild dementia. Patient presents with complaints of a fall. The fall was not witnessed but after hearing the thud his wife came to the room and found the patient sitting on the floor with complaining of pain in his right hand as well as right hip. With this the patient was taken to [facility] [place] and from there he was transferred to [facility] for right acetabular fracture.  Hospital Course:  Right Acetabular fracture/Right anterior iliac spine fracture/Hematoma The patient presented with a mechanical fall. He has a right closed acetabular fracture as well as a right anterior inferior iliac spine fracture and soft tissue  hematoma.  Orthopedic surgery was consulted. Appreciate Mellody Dance Stancil's thorough consult note. Per Ortho: No surgery planned. Conservative treatment. Touch Down Weight Bearing Status. Follow up with Ortho outpatient in 1-2 weeks.  Several metabolic bone health labs still pending which Ortho will follow.  DVT prophylaxis was discussed by Ortho and primary team.  It was decided to leave him on plavix alone.  Due to his thrombocytopenia, anemia, and increased fall risk he was not placed on lovenox or anticoagulation for DVT prophylaxis.  Pain Management Patient with severe pain from right hip inhibiting any movement.  Initially managed with oxycodone.  This was unsuccessful and caused the patient to be more agitated.  A fentanyl patch was placed on 1/7 and appeared to ease the patient's pain.  Further, he was given low dose xanax prn for anxiety.  Right radial fracture.  Admitting MD discussed with Dr. Izora Ribas who recommended splinting the wrist. Outpatient follow up has been [facility] office for next Monday 1/11 at 9:00 am.   Low grade fever Patient has continued to have a low grade fever since admission (99.0 - 100). Initially this was presumed to be due to acute fractures.   CXR negative, U/A negative. This could be from atelectasis or reabsorption of his hematoma.  Acute on Chronic hypoxic respiratory failure Patient with a history of COPD has required 1-2L of oxygen via nasal canula.  Without oxygen on room air he desaturates to 83%.  He is not wheezing.  His home COPD medication regimen has been continued.  Vitamin D Deficiency  Vit D, 25 hydroxy = 4.5. Ortho has started Vit D supplementation.   Question of Aspiration Patient coughing on liquids. Sounded wet on auscultation. CXR negative. Speech evaluation recommend regular diet with thin liquids.  No signs of aspiration observed.  Educated the family on appropriate eating / drinking habits.   Acute on Chronic  Thrombocytopenia Patient's platelets have dropped from 95k >> 66k >> 81k.  Patient with a history of thrombocytopenia as far back as 2008. Believe this acute drop is reactive. Smear sent for review.   Acute on Chronic Anemia  Patient with acute drop hgb. Was initially concerned patient may be bleeding into his hematoma. D/C'd Heparin & placed SCDs for prophylaxis. On 1/6 hgb is stable. Will continue to monitor. Restarting plavix 1/6.   CKD stage III  Baseline creatinine 1.5-2, as far back as 2008.  Creatinine is 1.24 and stable.  His CKD is likely due to his underlying diabetes.  Hyperkalemia Likely in the setting of chronic renal disease. Resolved with kayexalate.  DM  Resumed previous oral medications.  Well controlled.  Hemoglobin A1c is 6.0   Dementia  Patient had a CVA a few years back and probably has underlying vascular dementia, over the last year or year and a half he has been having a progressive decline in terms of his cognition as well as his functional level, he walks some in the house but mostly stays in a wheelchair.  Procedures:  none  Consultations:  Orthopedic surgery (Dr. Carola Frost)  Discharge Exam: Filed Vitals:   12/18/14 0800 12/18/14 0917 12/18/14 1200 12/18/14 1418  BP:    108/42  Pulse:    95  Temp:    99.9 F (37.7 C)  TempSrc:    Oral  Resp: 20  20 24   Height:      Weight:      SpO2: 96% 94% 96% 94%    Filed Weights   12/13/14 2345  Weight: 72.167 kg (159 lb 1.6 oz)    General: NAD, pleasant, Awake, Alert, Wife at bedside. Oxygen in place.  HEENT: no scleral icterus, MMM  Cardiovascular: RRR without murmurs, rubs or gallops.  Respiratory: CTA biL, no increased work of breathing.  Patient does cough up sputum in the morning.  Abdomen: soft, non tender Non distended, +bs.  MSK/Extremities: no cyanosis, warm. Right arm bandaged in gauze.  No visible hematoma in the area of the right hip.   Discharge  Instructions   Discharge Instructions    Diet Carb Modified    Complete by:  As directed      Increase activity slowly    Complete by:  As directed      Touch down weight bearing    Complete by:  As directed   Laterality:  right  Extremity:  Lower          Current Discharge Medication List    START taking these medications   Details  acetaminophen (TYLENOL) 500 MG tablet Take 1-2 tablets (500-1,000 mg total) by mouth every 6 (six) hours as needed for mild pain, moderate pain, fever or headache. Qty: 30 tablet, Refills: 0    albuterol (PROVENTIL) (2.5 MG/3ML) 0.083% nebulizer solution Take 3 mLs (2.5 mg total) by nebulization every 4 (four) hours as needed for wheezing or shortness of breath. Qty: 75 mL, Refills: 12    ALPRAZolam (XANAX) 0.25 MG tablet Take 1 tablet (0.25 mg total) by mouth 3 (three) times daily as needed for anxiety. Qty: 30 tablet, Refills: 0  cholecalciferol (VITAMIN D) 1000 UNITS tablet Take 2 tablets (2,000 Units total) by mouth daily. Qty: 60 tablet, Refills: 2    docusate sodium (COLACE) 100 MG capsule Take 1 capsule (100 mg total) by mouth 2 (two) times daily. Qty: 10 capsule, Refills: 0    feeding supplement, ENSURE COMPLETE, (ENSURE COMPLETE) LIQD Take 237 mLs by mouth 2 (two) times daily between meals.    fentaNYL (DURAGESIC - DOSED MCG/HR) 12 MCG/HR Place 1 patch (12.5 mcg total) onto the skin every 3 (three) days. Qty: 5 patch, Refills: 0    iron polysaccharides (NIFEREX) 150 MG capsule Take 1 capsule (150 mg total) by mouth daily.    methocarbamol (ROBAXIN) 500 MG tablet Take 1 tablet (500 mg total) by mouth every 6 (six) hours as needed for muscle spasms. Qty: 30 tablet    mometasone-formoterol (DULERA) 100-5 MCG/ACT AERO Inhale 2 puffs into the lungs 2 (two) times daily.    Multiple Vitamin (MULTIVITAMIN WITH MINERALS) TABS tablet Take 1 tablet by mouth daily.    tiotropium (SPIRIVA) 18 MCG inhalation capsule Place 1 capsule (18 mcg  total) into inhaler and inhale daily. Qty: 30 capsule, Refills: 12    Vitamin D, Ergocalciferol, (DRISDOL) 50000 UNITS CAPS capsule Take 1 capsule (50,000 Units total) by mouth every 7 (seven) days. Qty: 30 capsule      CONTINUE these medications which have NOT CHANGED   Details  clopidogrel (PLAVIX) 75 MG tablet Take 75 mg by mouth daily.    gabapentin (NEURONTIN) 300 MG capsule Take 300 mg by mouth 2 (two) times daily.     glimepiride (AMARYL) 4 MG tablet Take 4 mg by mouth daily with breakfast.    pioglitazone (ACTOS) 45 MG tablet Take 45 mg by mouth at bedtime.     sitaGLIPtin (JANUVIA) 100 MG tablet Take 100 mg by mouth at bedtime.     donepezil (ARICEPT) 10 MG tablet Take 10 mg by mouth at bedtime.       Allergies  Allergen Reactions  . Hydrocodone Other (See Comments)    ineffective  . Solarcaine [Benzocaine] Other (See Comments)    Severe burning  . Latex Itching and Rash   Follow-up Information    Follow up with HANDY,MICHAEL H, MD. Schedule an appointment as soon as possible for a visit in 2 weeks.   Specialty:  Orthopedic Surgery   Why:  follow up exam    Contact information:   [address] [place] [address] [phone]       Follow up with Molinda BailiffOLEY,HARRILL CHRISTOPHER, MD On 12/22/2014.   Specialty:  General Surgery   Why:  9:00 for wrist fracture   Contact information:   [address] [place] [address] [phone]        The results of significant diagnostics from this hospitalization (including imaging, microbiology, ancillary and laboratory) are listed below for reference.    Significant Diagnostic Studies: Dg Chest 1 View  12/13/2014   CLINICAL DATA:  Right hip pain since a fall earlier today.  EXAM: CHEST - 1 VIEW  COMPARISON:  08/29/2007  FINDINGS: Heart size is normal. Pulmonary vascularity is at the upper limits of normal. Lungs are clear. There is no compression fracture in the mid thoracic spine.   IMPRESSION: No acute abnormalities of the chest.   Electronically Signed   By: Geanie CooleyJim  Maxwell M.D.   On: 12/13/2014 18:10   Dg Wrist Complete Right  12/13/2014   CLINICAL DATA:  Status post fall today with pain.  EXAM: RIGHT WRIST - COMPLETE 3+ VIEW  COMPARISON:  None.  FINDINGS: There is diffuse osteopenia. There is minimal displaced fracture of the distal radius. There is probable nondisplaced fracture of distal ulna. There is no dislocation.  IMPRESSION: Fracture of distal radius. Probable nondisplaced fracture of distal ulna.   Electronically Signed   By: Sherian Rein M.D.   On: 12/13/2014 18:09   Dg Hip Bilateral W/pelvis  12/13/2014   CLINICAL DATA:  Acute right hip pain after fall today.  EXAM: BILATERAL HIP WITH PELVIS - 4+ VIEW  COMPARISON:  None.  FINDINGS: Status post surgical fixation of old proximal right femoral fracture. Diffuse osteopenia is noted. Mildly displaced fracture of the medial acetabulum is noted. Mild degenerative change of the left hip is noted.  IMPRESSION: Mildly displaced fracture of the medial acetabulum. CT scan is recommended for further evaluation.   Electronically Signed   By: Roque Lias M.D.   On: 12/13/2014 18:14   Ct Head Wo Contrast  12/13/2014   CLINICAL DATA:  Fall today.  EXAM: CT HEAD WITHOUT CONTRAST  CT CERVICAL SPINE WITHOUT CONTRAST  TECHNIQUE: Multidetector CT imaging of the head and cervical spine was performed following the standard protocol without intravenous contrast. Multiplanar CT image reconstructions of the cervical spine were also generated.  COMPARISON:  MRI brain 08/20/2007  FINDINGS: CT HEAD FINDINGS  A remote left PCA territory infarct it is noted. Other areas of remote encephalomalacia include in the anterior left frontal lobe than the posterior right frontal lobe and parietal lobe are otherwise stable. Diffuse white matter changes are evident. Remote lacunar infarcts are noted in the basal ganglia bilaterally. Extensive remote lacunar infarcts  are present within both cerebellar hemispheres.  No acute cortical infarct, hemorrhage, or mass lesion is present. The ventricles are proportionate to the degree of atrophy with ex vacuo dilation of the left lateral ventricle in particular. No significant extraaxial fluid collection is present.  The paranasal sinuses and mastoid air cells are clear. The calvarium is intact. Atherosclerotic calcifications are present within the cavernous internal carotid arteries bilaterally.  CT CERVICAL SPINE FINDINGS  Leftward curvature of the cervical spine is noted. Vertebral body heights AP alignment are maintained. No acute fracture or traumatic subluxation is evident. Mild degenerative changes are noted at the C1-2 arch. The soft tissues of the neck demonstrate atherosclerotic calcifications bilaterally, worse on the right. No other focal lesions are evident. Severe emphysematous changes are present.  IMPRESSION: 1. Multiple remote infarcts as described. 2. Remote lacunar infarcts within the basal ganglia and cerebellum. 3. No acute intracranial abnormality or evidence for acute trauma. 4. Levoconvex curvature of the cervical spine without evidence for acute trauma. 5. Severe emphysematous changes.   Electronically Signed   By: Gennette Pac M.D.   On: 12/13/2014 20:41   Ct Cervical Spine Wo Contrast  12/13/2014   CLINICAL DATA:  Fall today.  EXAM: CT HEAD WITHOUT CONTRAST  CT CERVICAL SPINE WITHOUT CONTRAST  TECHNIQUE: Multidetector CT imaging of the head and cervical spine was performed following the standard protocol without intravenous contrast. Multiplanar CT image reconstructions of the cervical spine were also generated.  COMPARISON:  MRI brain 08/20/2007  FINDINGS: CT HEAD FINDINGS  A remote left PCA territory infarct it is noted. Other areas of remote encephalomalacia include in the anterior left frontal lobe than the posterior right frontal lobe and parietal lobe are otherwise stable. Diffuse white matter changes  are evident. Remote  lacunar infarcts are noted in the basal ganglia bilaterally. Extensive remote lacunar infarcts are present within both cerebellar hemispheres.  No acute cortical infarct, hemorrhage, or mass lesion is present. The ventricles are proportionate to the degree of atrophy with ex vacuo dilation of the left lateral ventricle in particular. No significant extraaxial fluid collection is present.  The paranasal sinuses and mastoid air cells are clear. The calvarium is intact. Atherosclerotic calcifications are present within the cavernous internal carotid arteries bilaterally.  CT CERVICAL SPINE FINDINGS  Leftward curvature of the cervical spine is noted. Vertebral body heights AP alignment are maintained. No acute fracture or traumatic subluxation is evident. Mild degenerative changes are noted at the C1-2 arch. The soft tissues of the neck demonstrate atherosclerotic calcifications bilaterally, worse on the right. No other focal lesions are evident. Severe emphysematous changes are present.  IMPRESSION: 1. Multiple remote infarcts as described. 2. Remote lacunar infarcts within the basal ganglia and cerebellum. 3. No acute intracranial abnormality or evidence for acute trauma. 4. Levoconvex curvature of the cervical spine without evidence for acute trauma. 5. Severe emphysematous changes.   Electronically Signed   By: Gennette Pac M.D.   On: 12/13/2014 20:41   Ct Pelvis Wo Contrast  12/13/2014   CLINICAL DATA:  Status post fall today; persistent moderate right hip pain status post fall. CT recommended for further evaluation of right-sided pelvic fracture. Initial encounter.  EXAM: CT PELVIS WITHOUT CONTRAST  TECHNIQUE: Multidetector CT imaging of the pelvis was performed following the standard protocol without intravenous contrast.  COMPARISON:  Pelvis and bilateral hip radiographs performed earlier today at 5:31 p.m.  FINDINGS: There appears to be a T-shaped fracture through the right acetabulum;  the transverse component is thought to be juxtatectal in nature, with perhaps 5 mm of separation. The anterior component demonstrates only minimal displacement, measuring perhaps 3 mm, and extends into the anterior wall more inferiorly, with partial disruption at the lateral aspect of the right superior pubic ramus. There is a minimally displaced fracture of the right inferior pubic ramus.  There also appears to be an avulsion fracture involving the right anterior inferior iliac spine, with mild displacement. No additional fractures are seen.  The sacroiliac joints are grossly unremarkable. The pubic symphysis is grossly unremarkable in appearance. The patient's right femoral hardware is grossly unremarkable in appearance, though difficult to fully characterize. No significant joint space narrowing is seen. There is significant chronic compression deformity of vertebral body L4.  Visualized small and large bowel loops are grossly unremarkable. Diffuse calcification is seen along the distal abdominal aorta and its branches. Mild soft tissue hematoma is noted along the right pelvic sidewall, tracking minimally into the right lower quadrant in a retroperitoneal distribution, with mild leftward displacement of the bladder.  IMPRESSION: 1. T-shaped fracture of the right acetabulum; the transverse component is thought to be juxtatectal in nature. The anterior component extends into the anterior wall more inferiorly, with partial disruption at the lateral aspect of the right superior pubic ramus. Minimally displaced fracture of the right inferior pubic ramus. Displacement measures perhaps 5 mm at the transverse component and 3 mm at the anterior component. 2. Avulsion fracture involving the right anterior inferior iliac spine, with mild displacement. 3. Associated mild soft tissue hematoma along the right pelvic sidewall, tracking minimally into the right lower quadrant in a retroperitoneal distribution. 4. Diffuse  calcification along the distal abdominal aorta and its branches. 5. Significant chronic compression deformity of vertebral body L4.   Electronically  Signed   By: Roanna Raider M.D.   On: 12/13/2014 21:29   Dg Pelvis Comp Min 3v  12/16/2014   CLINICAL DATA:  Fall a few months ago with persistent right hip pain.  EXAM: JUDET PELVIS - 3+ VIEW  COMPARISON:  12/13/2014 and CT 12/13/2014  FINDINGS: There is diffuse decreased bone mineralization. Fixation hardware intact over the right proximal femur. Moderate bilateral osteoarthritic changes of the hips. Continued evidence of patient's known right acetabular fracture. Evidence of patient's known right inferior pubic ramus fracture. Degenerate changes of the spine. Calcified plaque over the abdominal aorta and iliac arteries. Remainder of the exam is unchanged.  IMPRESSION: No significant change in patient's known right acetabular fracture and right inferior pubis ramus fracture.  Moderate diffuse decreased bone mineralization with moderate bilateral osteoarthritis of the hips.   Electronically Signed   By: Elberta Fortis M.D.   On: 12/16/2014 11:34   Dg Chest Port 1 View  12/17/2014   CLINICAL DATA:  Shortness of breath, pneumonia  EXAM: PORTABLE CHEST - 1 VIEW  COMPARISON:  12/14/2014  FINDINGS: Cardiac shadow is stable. The lungs are again well aerated. Given some limitation secondary to patient rotation no focal infiltrate or sizable effusion is seen. No acute bony abnormality is noted.  IMPRESSION: No active disease.   Electronically Signed   By: Alcide Clever M.D.   On: 12/17/2014 10:39   Dg Chest Port 1 View  12/14/2014   CLINICAL DATA:  Shortness of breath. Patient fell in broke right pelvis.  EXAM: PORTABLE CHEST - 1 VIEW  COMPARISON:  12/13/2014  FINDINGS: Shallow inspiration. Heart size and pulmonary vascularity are normal for technique. Suggestion of mild atelectasis in the left lung base. No blunting of costophrenic angles. No pneumothorax. Calcified and  tortuous aorta. Degenerative changes in the spine and shoulders.  IMPRESSION: Suggestion of slight atelectasis in the left lung base.   Electronically Signed   By: Burman Nieves M.D.   On: 12/14/2014 04:38   Dg Knee Complete 4 Views Right  12/15/2014   CLINICAL DATA:  Right knee pain since a fall several days ago. Acute right acetabular fracture  EXAM: RIGHT KNEE - COMPLETE 4+ VIEW  COMPARISON:  Report of radiographs dated 01/12/2004  FINDINGS: There is diffuse osteopenia. No joint space narrowing. No fracture or dislocation or joint effusion.  IMPRESSION: No acute abnormality.   Electronically Signed   By: Geanie Cooley M.D.   On: 12/15/2014 16:20   Dg Abd 2 Views  12/15/2014   CLINICAL DATA:  Left lower quadrant abdominal pain. Acute right hip fracture.  EXAM: ABDOMEN - 2 VIEW  COMPARISON:  CT scan of the pelvis dated 12/13/2014 and radiograph dated 12/13/1998 extend  FINDINGS: Bowel gas pattern is normal. No free air or free fluid. Old compression fracture of L4. Extensive vascular calcification in the abdomen and pelvis. Right acetabular fracture is is quite subtle on the AP view.  IMPRESSION: Benign appearing abdomen except for right acetabular fracture and old L4 fracture.   Electronically Signed   By: Geanie Cooley M.D.   On: 12/15/2014 16:24       Labs: Basic Metabolic Panel:  Recent Labs Lab 12/13/14 1640 12/14/14 0030 12/14/14 0601 12/15/14 0030 12/17/14 0643  NA 140  --  140 137 135  K 5.2*  --  5.3* 4.4 4.4  CL 109  --  111 109 103  CO2 26  --  GLUCOSE 189*  --  168* 113* 218*  BUN 20  --  CREATININE 1.70*  --  1.70* 1.56* 1.24  CALCIUM 8.6  --  8.1* 8.1* 7.9*  MG  --  1.6  --   --   --   PHOS  --   --   --   --  2.9   Liver Function Tests:  Recent Labs Lab 12/13/14 1640 12/14/14 0601 12/15/14 0030  AST ALT ALKPHOS 69 60 59  BILITOT 0.5 0.4 0.7  PROT 6.5 5.5* 5.2*  ALBUMIN 3.3* 2.9* 2.7*   CBC:  Recent Labs Lab  12/13/14 1640 12/14/14 0601 12/15/14 0030 12/16/14 0341 12/17/14 0643  WBC 8.1 9.0 9.2 7.2 5.8  NEUTROABS 6.8  --   --   --   --   HGB 12.2* 10.9* 10.8* 9.8* 9.9*  HCT 36.9* 35.2* 34.6* 30.9* 31.1*  MCV 101.1* 101.7* 100.9* 99.4 99.0  PLT 95* 89* 75* 66* 81*   CBG:  Recent Labs Lab 12/17/14 1208 12/17/14 1718 12/17/14 2240 12/18/14 0814 12/18/14 1221  GLUCAP 162* 158* 148* 147* 157*       Signed:  York, Marianne L, PA - C  [facility] 12/18/2014, 2:40 PM   I have seen and evaluated this patient and agree with the PA note. Patient states feeling better, in better mood today. Pain better controlled, wife and daughter in law in room, all question answered. Patient is to discharge to snf.

## 2014-12-18 NOTE — Progress Notes (Signed)
Physical Therapy Treatment Patient Details Name: Johnathan [name] MRN: [id number] DOB: 1935/12/18 Today's Date: 12/18/2014    History of Present Illness Pt is a 79 y.o. male adm due to fall resulting in R acetabulum fracture and Rt distal radius fx. From ortho standpoint pt is being treated non surgically. pt with hx of Lt wrist drop and dementia.     PT Comments    Pt refusing to work on bed mobility or sit EOB during today's session. Pt very sensitive with movement to both LEs and kept stating that the therapists were moving his legs when they were not. Pt with decreased ROM particularly on the Right side, probably limited by pain. PT told pt that the goal tomorrow if he is still here to sit EOB. Pt's wife and daughter-in-law present and were educated on the importance of encouraging pt to move ankles and knees throughout the day.    Follow Up Recommendations  SNF;Supervision/Assistance - 24 hour     Equipment Recommendations  Other (comment) (TBD)    Recommendations for Other Services       Precautions / Restrictions Precautions Precautions: Fall Restrictions Weight Bearing Restrictions: Yes RUE Weight Bearing: Non weight bearing RLE Weight Bearing: Touchdown weight bearing    Mobility  Bed Mobility               General bed mobility comments: Pt refusing to perform bed mobility with PT during session. Pt able to reposition pillows under pt.   Transfers                    Ambulation/Gait                 Stairs            Wheelchair Mobility    Modified Rankin (Stroke Patients Only)       Balance Overall balance assessment: History of Falls                                  Cognition Arousal/Alertness: Awake/alert Behavior During Therapy: Anxious Overall Cognitive Status: History of cognitive impairments - at baseline Area of Impairment: Problem solving;Safety/judgement;Following commands;Memory     Memory: Decreased  recall of precautions Following Commands: Follows one step commands with increased time Safety/Judgement: Decreased awareness of safety   Problem Solving: Decreased initiation;Slow processing;Requires verbal cues;Requires tactile cues      Exercises Low Level/ICU Exercises Ankle Circles/Pumps: AROM;AAROM;Both;10 reps;Supine Short Arc Quad: AROM;Supine;10 reps;Right Heel Slides: AROM;AAROM;Right;10 reps;Supine    General Comments General comments (skin integrity, edema, etc.): Pt with a raw area on his Left lateral hip that he was itching continuously throughout session. Pt applied cream to area and positioned pillow to block pt from being able to scratch at the area further.       Pertinent Vitals/Pain Pain Assessment: Faces Faces Pain Scale: Hurts whole lot Pain Location: Right and Left hips with movement Pain Descriptors / Indicators: Grimacing;Guarding Pain Intervention(s): Limited activity within patient's tolerance;Monitored during session;Repositioned    Home Living                      Prior Function            PT Goals (current goals can now be found in the care plan section) Progress towards PT goals: Not progressing toward goals - comment (Pt refusing to attempt bed mobility )  Frequency  Min 3X/week    PT Plan Current plan remains appropriate    Co-evaluation              End of Session Equipment Utilized During Treatment: Oxygen Activity Tolerance: Patient limited by pain Patient left: in bed;with call bell/phone within reach;with family/visitor present     Time: 1202-1222 PT Time Calculation (min) (ACUTE ONLY): 20 min  Charges:  $Therapeutic Exercise: 8-22 mins                    G Codes:      Tawni MillersWhite, Samaria Anes F SPT 12/18/2014, 1:54 PM  [place] SpanielOlivia Hebert, [place]  Acute Rehabilitation [phone]  [facility] Kaine Mcquillen,PT Acute Rehabilitation [phone] (pager)

## 2014-12-18 NOTE — Progress Notes (Signed)
Speech Language Pathology Treatment: Dysphagia  Patient Details Name: Johnathan [name] MRN: [id number] DOB: December 07, 1936 Today's Date: 12/18/2014 Time: 0930-0950 SLP Time Calculation (min) (ACUTE ONLY): 20 min  Assessment / Plan / Recommendation Clinical Impression  Skilled treatment session focused on addressing observation of diet toleration as well as wrap up of family education.  Patient had already consumed breakfast prior to SLP entering room and declined further PO trilas; however, wife reports that he had no coughing during meal.  SLP provided skilled observation of patient consuming thin liquids via straw with Min verbal cues to consume 1-2 sips at a time; which was effective at preventing overt s/s of aspiration.  RN also administered medications one at a time with thin liquids via single straw sips which patient tolerated well.  Wife repots having questions and concerns regarding swallow function addressed with management of positioning and portion control.  As a result no further acute skilled SLP services are warranted at this time.      HPI HPI: Johnathan [name] is a 79 year old male with past medical history of diabetes mellitus, CVA, history of right femur fracture 2005, history of left wrist drop since childhood, and dementia. Patient presents with complaints of a fall with right acetabular fracture and family report of intermittent overt s/s of aspiration with PO.     Pertinent Vitals Pain Assessment: 0-10 Faces Pain Scale: Hurts whole lot Pain Location: right lower extremity with movement  Pain Descriptors / Indicators: Grimacing Pain Intervention(s): Monitored during session;Limited activity within patient's tolerance  SLP Plan  All goals met;Discharge SLP treatment due to (comment) (goals met and discharging to next level fo care)    Recommendations Medication Administration: Whole meds with liquid Supervision: Full supervision/cueing for compensatory strategies;Trained caregiver to  feed patient Compensations: Slow rate;Small sips/bites Postural Changes and/or Swallow Maneuvers: Seated upright 90 degrees              Oral Care Recommendations: Oral care BID Follow up Recommendations: Skilled Nursing facility Plan: All goals met;Discharge SLP treatment due to (comment) (goals met and discharging to next level fo care)    GO    Gunnar Fusi, M.A., CCC-SLP [place] 12/18/2014, 10:33 AM

## 2014-12-21 LAB — PTH, INTACT AND CALCIUM
Calcium, Total (PTH): 7.9 mg/dL — ABNORMAL LOW (ref 8.6–10.2)
PTH: 27 pg/mL (ref 15–65)

## 2014-12-22 LAB — PROTEIN ELECTROPHORESIS, SERUM
ALBUMIN ELP: 46.5 % — AB (ref 55.8–66.1)
ALPHA-1-GLOBULIN: 7.9 % — AB (ref 2.9–4.9)
Alpha-2-Globulin: 18.1 % — ABNORMAL HIGH (ref 7.1–11.8)
Beta 2: 7.9 % — ABNORMAL HIGH (ref 3.2–6.5)
Beta Globulin: 7 % (ref 4.7–7.2)
GAMMA GLOBULIN: 12.6 % (ref 11.1–18.8)
M-Spike, %: NOT DETECTED g/dL
Total Protein ELP: 5.1 g/dL — ABNORMAL LOW (ref 6.0–8.3)

## 2015-01-07 ENCOUNTER — Inpatient Hospital Stay (HOSPITAL_COMMUNITY)
Admission: EM | Admit: 2015-01-07 | Discharge: 2015-01-09 | DRG: 193 | Disposition: A | Discharge status: Skilled Nursing Facility | Payer: Medicare Other | Attending: Internal Medicine | Admitting: Internal Medicine

## 2015-01-07 ENCOUNTER — Encounter (HOSPITAL_COMMUNITY): Payer: Self-pay | Admitting: *Deleted

## 2015-01-07 ENCOUNTER — Emergency Department (HOSPITAL_COMMUNITY): Payer: Medicare Other

## 2015-01-07 DIAGNOSIS — D696 Thrombocytopenia, unspecified: Secondary | ICD-10-CM | POA: Diagnosis present

## 2015-01-07 DIAGNOSIS — E860 Dehydration: Secondary | ICD-10-CM | POA: Diagnosis present

## 2015-01-07 DIAGNOSIS — Y95 Nosocomial condition: Secondary | ICD-10-CM | POA: Diagnosis present

## 2015-01-07 DIAGNOSIS — R946 Abnormal results of thyroid function studies: Secondary | ICD-10-CM | POA: Diagnosis present

## 2015-01-07 DIAGNOSIS — Z885 Allergy status to narcotic agent status: Secondary | ICD-10-CM

## 2015-01-07 DIAGNOSIS — J449 Chronic obstructive pulmonary disease, unspecified: Secondary | ICD-10-CM | POA: Diagnosis present

## 2015-01-07 DIAGNOSIS — Z8673 Personal history of transient ischemic attack (TIA), and cerebral infarction without residual deficits: Secondary | ICD-10-CM

## 2015-01-07 DIAGNOSIS — E559 Vitamin D deficiency, unspecified: Secondary | ICD-10-CM | POA: Diagnosis present

## 2015-01-07 DIAGNOSIS — Z91040 Latex allergy status: Secondary | ICD-10-CM

## 2015-01-07 DIAGNOSIS — E119 Type 2 diabetes mellitus without complications: Secondary | ICD-10-CM

## 2015-01-07 DIAGNOSIS — R05 Cough: Secondary | ICD-10-CM | POA: Diagnosis present

## 2015-01-07 DIAGNOSIS — I129 Hypertensive chronic kidney disease with stage 1 through stage 4 chronic kidney disease, or unspecified chronic kidney disease: Secondary | ICD-10-CM | POA: Diagnosis present

## 2015-01-07 DIAGNOSIS — D649 Anemia, unspecified: Secondary | ICD-10-CM | POA: Diagnosis present

## 2015-01-07 DIAGNOSIS — Z8781 Personal history of (healed) traumatic fracture: Secondary | ICD-10-CM | POA: Diagnosis not present

## 2015-01-07 DIAGNOSIS — J9601 Acute respiratory failure with hypoxia: Secondary | ICD-10-CM | POA: Diagnosis present

## 2015-01-07 DIAGNOSIS — N179 Acute kidney failure, unspecified: Secondary | ICD-10-CM | POA: Diagnosis present

## 2015-01-07 DIAGNOSIS — D638 Anemia in other chronic diseases classified elsewhere: Secondary | ICD-10-CM | POA: Diagnosis present

## 2015-01-07 DIAGNOSIS — N183 Chronic kidney disease, stage 3 unspecified: Secondary | ICD-10-CM | POA: Diagnosis present

## 2015-01-07 DIAGNOSIS — S32409A Unspecified fracture of unspecified acetabulum, initial encounter for closed fracture: Secondary | ICD-10-CM | POA: Diagnosis present

## 2015-01-07 DIAGNOSIS — J189 Pneumonia, unspecified organism: Principal | ICD-10-CM | POA: Diagnosis present

## 2015-01-07 DIAGNOSIS — F17210 Nicotine dependence, cigarettes, uncomplicated: Secondary | ICD-10-CM | POA: Diagnosis present

## 2015-01-07 DIAGNOSIS — S5291XA Unspecified fracture of right forearm, initial encounter for closed fracture: Secondary | ICD-10-CM | POA: Diagnosis present

## 2015-01-07 DIAGNOSIS — F0390 Unspecified dementia without behavioral disturbance: Secondary | ICD-10-CM | POA: Diagnosis present

## 2015-01-07 DIAGNOSIS — D72829 Elevated white blood cell count, unspecified: Secondary | ICD-10-CM | POA: Insufficient documentation

## 2015-01-07 HISTORY — DX: Unspecified acquired deformity of left upper arm: M21.922

## 2015-01-07 HISTORY — DX: Nicotine dependence, unspecified, uncomplicated: F17.200

## 2015-01-07 HISTORY — DX: Unspecified fracture of right forearm, subsequent encounter for closed fracture with routine healing: S52.91XD

## 2015-01-07 HISTORY — DX: Unspecified fracture of right acetabulum, subsequent encounter for fracture with routine healing: S32.401D

## 2015-01-07 HISTORY — DX: Fracture of unspecified part of neck of right femur, subsequent encounter for closed fracture with routine healing: S72.001D

## 2015-01-07 HISTORY — DX: Unspecified fall, initial encounter: W19.XXXA

## 2015-01-07 HISTORY — DX: Vitamin D deficiency, unspecified: E55.9

## 2015-01-07 LAB — COMPREHENSIVE METABOLIC PANEL
ALK PHOS: 105 U/L (ref 39–117)
ALT: 8 U/L (ref 0–53)
AST: 17 U/L (ref 0–37)
Albumin: 2.4 g/dL — ABNORMAL LOW (ref 3.5–5.2)
Anion gap: 6 (ref 5–15)
BILIRUBIN TOTAL: 0.6 mg/dL (ref 0.3–1.2)
BUN: 41 mg/dL — ABNORMAL HIGH (ref 6–23)
CHLORIDE: 100 mmol/L (ref 96–112)
CO2: 31 mmol/L (ref 19–32)
Calcium: 8.5 mg/dL (ref 8.4–10.5)
Creatinine, Ser: 1.69 mg/dL — ABNORMAL HIGH (ref 0.50–1.35)
GFR calc Af Amer: 44 mL/min — ABNORMAL LOW (ref >90)
GFR calc non Af Amer: 38 mL/min — ABNORMAL LOW (ref >90)
Glucose, Bld: 75 mg/dL (ref 70–99)
Potassium: 4.8 mmol/L (ref 3.5–5.1)
SODIUM: 137 mmol/L (ref 135–145)
TOTAL PROTEIN: 5.8 g/dL — AB (ref 6.0–8.3)

## 2015-01-07 LAB — CBC WITH DIFFERENTIAL/PLATELET
Basophils Absolute: 0 10*3/uL (ref 0.0–0.1)
Basophils Relative: 0 % (ref 0–1)
EOS PCT: 2 % (ref 0–5)
Eosinophils Absolute: 0.2 10*3/uL (ref 0.0–0.7)
HEMATOCRIT: 28.7 % — AB (ref 39.0–52.0)
HEMOGLOBIN: 9 g/dL — AB (ref 13.0–17.0)
Lymphocytes Relative: 6 % — ABNORMAL LOW (ref 12–46)
Lymphs Abs: 0.8 10*3/uL (ref 0.7–4.0)
MCH: 31.1 pg (ref 26.0–34.0)
MCHC: 31.4 g/dL (ref 30.0–36.0)
MCV: 99.3 fL (ref 78.0–100.0)
Monocytes Absolute: 0.8 10*3/uL (ref 0.1–1.0)
Monocytes Relative: 6 % (ref 3–12)
Neutro Abs: 11.2 10*3/uL — ABNORMAL HIGH (ref 1.7–7.7)
Neutrophils Relative %: 86 % — ABNORMAL HIGH (ref 43–77)
PLATELETS: 157 10*3/uL (ref 150–400)
RBC: 2.89 MIL/uL — ABNORMAL LOW (ref 4.22–5.81)
RDW: 14.3 % (ref 11.5–15.5)
WBC: 13.1 10*3/uL — ABNORMAL HIGH (ref 4.0–10.5)

## 2015-01-07 LAB — GLUCOSE, CAPILLARY
GLUCOSE-CAPILLARY: 116 mg/dL — AB (ref 70–99)
Glucose-Capillary: 52 mg/dL — ABNORMAL LOW (ref 70–99)
Glucose-Capillary: 161 mg/dL — ABNORMAL HIGH (ref 70–99)

## 2015-01-07 MED ORDER — METHOCARBAMOL 500 MG PO TABS: 500.0000 mg | ORAL_TABLET | Freq: Four times a day (QID) | ORAL | Status: DC | PRN

## 2015-01-07 MED ORDER — FENTANYL 12 MCG/HR TD PT72
12.5000 ug | MEDICATED_PATCH | TRANSDERMAL | Status: DC
  Administered 2015-01-08: 12.5 ug via TRANSDERMAL
  Filled 2015-01-07: qty 1

## 2015-01-07 MED ORDER — DEXTROSE 5 % IV SOLN
1.0000 g | Freq: Once | INTRAVENOUS | Status: AC
  Administered 2015-01-07: 1 g via INTRAVENOUS
  Filled 2015-01-07: qty 1

## 2015-01-07 MED ORDER — GUAIFENESIN 100 MG/5ML PO SYRP
200.0000 mg | ORAL_SOLUTION | ORAL | Status: DC | PRN
  Filled 2015-01-07: qty 10

## 2015-01-07 MED ORDER — ALPRAZOLAM 0.25 MG PO TABS: 0.2500 mg | ORAL_TABLET | Freq: Three times a day (TID) | ORAL | Status: DC | PRN

## 2015-01-07 MED ORDER — INSULIN ASPART 100 UNIT/ML ~~LOC~~ SOLN: 0.0000 [IU] | Freq: Every day | SUBCUTANEOUS | Status: DC

## 2015-01-07 MED ORDER — VANCOMYCIN HCL 10 G IV SOLR
1250.0000 mg | Freq: Once | INTRAVENOUS | Status: AC
  Administered 2015-01-07: 1250 mg via INTRAVENOUS
  Filled 2015-01-07: qty 1250

## 2015-01-07 MED ORDER — DOCUSATE SODIUM 100 MG PO CAPS
100.0000 mg | ORAL_CAPSULE | Freq: Two times a day (BID) | ORAL | Status: DC
  Administered 2015-01-07 – 2015-01-09 (×4): 100 mg via ORAL
  Filled 2015-01-07 (×4): qty 1

## 2015-01-07 MED ORDER — SODIUM CHLORIDE 0.9 % IV SOLN
INTRAVENOUS | Status: DC
  Administered 2015-01-08 (×2): via INTRAVENOUS
  Filled 2015-01-07: qty 1000

## 2015-01-07 MED ORDER — ALBUTEROL SULFATE (2.5 MG/3ML) 0.083% IN NEBU: 2.5000 mg | INHALATION_SOLUTION | RESPIRATORY_TRACT | Status: DC | PRN

## 2015-01-07 MED ORDER — ADULT MULTIVITAMIN W/MINERALS CH
1.0000 | ORAL_TABLET | Freq: Every day | ORAL | Status: DC
  Administered 2015-01-08 – 2015-01-09 (×2): 1 via ORAL
  Filled 2015-01-07 (×2): qty 1

## 2015-01-07 MED ORDER — POLYSACCHARIDE IRON COMPLEX 150 MG PO CAPS
150.0000 mg | ORAL_CAPSULE | Freq: Every day | ORAL | Status: DC
  Administered 2015-01-08 – 2015-01-09 (×2): 150 mg via ORAL
  Filled 2015-01-07 (×3): qty 1

## 2015-01-07 MED ORDER — INSULIN ASPART 100 UNIT/ML ~~LOC~~ SOLN
0.0000 [IU] | Freq: Three times a day (TID) | SUBCUTANEOUS | Status: DC
  Administered 2015-01-08 – 2015-01-09 (×2): 1 [IU] via SUBCUTANEOUS

## 2015-01-07 MED ORDER — ONDANSETRON HCL 4 MG/2ML IJ SOLN: 4.0000 mg | Freq: Four times a day (QID) | INTRAMUSCULAR | Status: DC | PRN

## 2015-01-07 MED ORDER — VITAMIN D (ERGOCALCIFEROL) 1.25 MG (50000 UNIT) PO CAPS
50000.0000 [IU] | ORAL_CAPSULE | ORAL | Status: DC
  Filled 2015-01-07: qty 1

## 2015-01-07 MED ORDER — IOHEXOL 350 MG/ML SOLN
80.0000 mL | Freq: Once | INTRAVENOUS | Status: AC | PRN
  Administered 2015-01-07: 80 mL via INTRAVENOUS

## 2015-01-07 MED ORDER — SODIUM CHLORIDE 0.9 % IV BOLUS (SEPSIS)
1000.0000 mL | Freq: Once | INTRAVENOUS | Status: AC
Start: 2015-01-07 — End: 2015-01-07
  Administered 2015-01-07: 1000 mL via INTRAVENOUS

## 2015-01-07 MED ORDER — MOMETASONE FURO-FORMOTEROL FUM 100-5 MCG/ACT IN AERO
2.0000 | INHALATION_SPRAY | Freq: Two times a day (BID) | RESPIRATORY_TRACT | Status: DC
  Administered 2015-01-08 – 2015-01-09 (×2): 2 via RESPIRATORY_TRACT
  Filled 2015-01-07: qty 8.8

## 2015-01-07 MED ORDER — ENSURE COMPLETE PO LIQD
237.0000 mL | Freq: Two times a day (BID) | ORAL | Status: DC
  Administered 2015-01-08: 237 mL via ORAL

## 2015-01-07 MED ORDER — DONEPEZIL HCL 10 MG PO TABS
10.0000 mg | ORAL_TABLET | Freq: Every day | ORAL | Status: DC
  Administered 2015-01-07 – 2015-01-08 (×2): 10 mg via ORAL
  Filled 2015-01-07 (×3): qty 1

## 2015-01-07 MED ORDER — TIOTROPIUM BROMIDE MONOHYDRATE 18 MCG IN CAPS
18.0000 ug | ORAL_CAPSULE | Freq: Every day | RESPIRATORY_TRACT | Status: DC
  Filled 2015-01-07 (×2): qty 5

## 2015-01-07 MED ORDER — ACETAMINOPHEN 500 MG PO TABS: 500.0000 mg | ORAL_TABLET | Freq: Four times a day (QID) | ORAL | Status: DC | PRN

## 2015-01-07 MED ORDER — DEXTROSE 5 % IV SOLN: 1.0000 g | Freq: Three times a day (TID) | INTRAVENOUS | Status: DC

## 2015-01-07 MED ORDER — VITAMIN D3 25 MCG (1000 UNIT) PO TABS
2000.0000 [IU] | ORAL_TABLET | Freq: Every day | ORAL | Status: DC
  Administered 2015-01-08 – 2015-01-09 (×2): 2000 [IU] via ORAL
  Filled 2015-01-07 (×3): qty 2

## 2015-01-07 MED ORDER — DEXTROSE 5 % IV SOLN
2.0000 g | INTRAVENOUS | Status: DC
  Administered 2015-01-07 – 2015-01-09 (×3): 2 g via INTRAVENOUS
  Filled 2015-01-07 (×3): qty 2

## 2015-01-07 MED ORDER — IPRATROPIUM-ALBUTEROL 0.5-2.5 (3) MG/3ML IN SOLN
3.0000 mL | RESPIRATORY_TRACT | Status: DC
  Administered 2015-01-07 – 2015-01-08 (×3): 3 mL via RESPIRATORY_TRACT
  Filled 2015-01-07 (×3): qty 3

## 2015-01-07 MED ORDER — VANCOMYCIN HCL IN DEXTROSE 1-5 GM/200ML-% IV SOLN: 1000.0000 mg | INTRAVENOUS | Status: DC

## 2015-01-07 MED ORDER — DEXTROSE 50 % IV SOLN
INTRAVENOUS | Status: AC
Start: 2015-01-07 — End: 2015-01-08
  Filled 2015-01-07: qty 50

## 2015-01-07 MED ORDER — GABAPENTIN 300 MG PO CAPS
300.0000 mg | ORAL_CAPSULE | Freq: Two times a day (BID) | ORAL | Status: DC
  Administered 2015-01-07 – 2015-01-09 (×4): 300 mg via ORAL
  Filled 2015-01-07 (×5): qty 1

## 2015-01-07 MED ORDER — CLOPIDOGREL BISULFATE 75 MG PO TABS
75.0000 mg | ORAL_TABLET | Freq: Every day | ORAL | Status: DC
  Administered 2015-01-08 – 2015-01-09 (×2): 75 mg via ORAL
  Filled 2015-01-07 (×2): qty 1

## 2015-01-07 MED ORDER — VANCOMYCIN HCL IN DEXTROSE 1-5 GM/200ML-% IV SOLN
1000.0000 mg | INTRAVENOUS | Status: DC
  Administered 2015-01-08: 1000 mg via INTRAVENOUS
  Filled 2015-01-07 (×2): qty 200

## 2015-01-07 MED ORDER — GUAIFENESIN ER 600 MG PO TB12
1200.0000 mg | ORAL_TABLET | Freq: Two times a day (BID) | ORAL | Status: DC
  Administered 2015-01-07 – 2015-01-09 (×4): 1200 mg via ORAL
  Filled 2015-01-07 (×5): qty 2

## 2015-01-07 MED ORDER — HEPARIN SODIUM (PORCINE) 5000 UNIT/ML IJ SOLN
5000.0000 [IU] | Freq: Three times a day (TID) | INTRAMUSCULAR | Status: DC
  Administered 2015-01-07 – 2015-01-09 (×5): 5000 [IU] via SUBCUTANEOUS
  Filled 2015-01-07 (×8): qty 1

## 2015-01-07 NOTE — ED Notes (Signed)
Pt arrived via [facility] from [facility].  Fever reported last night, elevated d-dimer, and low urine output. I & O at 1015 due to no urine for 8 hours, 350cc out. Tylenol given last at 1655 PM on 01/06/15.  On arrival, BP-111/49 HR-69 R-17 O2-96% on 2L. T-98.8 oral.  Pt recently here from a fall, resulting in a R hip fx and R wrist fracture.  Pt was started on antibiotics (Cipro) on 01/06/15 PM. Pt has hx of dimentia.

## 2015-01-07 NOTE — ED Provider Notes (Signed)
CSN: [id number]     Arrival date & time 01/07/15  1123 History   First MD Initiated Contact with Patient 01/07/15 1130     Chief Complaint  Patient presents with  . Fever  . elevated d-dimer    elevated d-dimer     (Consider location/radiation/quality/duration/timing/severity/associated sxs/prior Treatment) Patient is a 79 y.o. male presenting with fever.  Fever Max temp prior to arrival:  103 Temp source:  Oral Severity:  Moderate Onset quality:  Gradual Duration:  2 days Timing:  Constant Progression:  Worsening Chronicity:  New Relieved by:  Nothing Worsened by:  Nothing tried Ineffective treatments:  None tried Associated symptoms: no chills, no cough, no dysuria, no nausea and no vomiting     Past Medical History  Diagnosis Date  . Diabetes mellitus without complication   . Stroke   . COPD (chronic obstructive pulmonary disease)   . Falls   . Vitamin D deficiency   . Acquired deformity of left upper arm   . Nicotine dependence   . Closed fracture of neck of right femur with routine healing   . Fracture of right acetabulum with routine healing   . Closed fracture of right forearm with routine healing    Past Surgical History  Procedure Laterality Date  . Intramedullary (im) nail intertrochanteric Right 2005    [name]    No family history on file. History  Substance Use Topics  . Smoking status: Current Every Day Smoker -- 2.00 packs/day    Types: Cigarettes  . Smokeless tobacco: Not on file  . Alcohol Use: No    Review of Systems  Constitutional: Positive for fever. Negative for chills.  Respiratory: Negative for cough.   Gastrointestinal: Negative for nausea and vomiting.  Genitourinary: Negative for dysuria.  All other systems reviewed and are negative.     Allergies  Hydrocodone; Solarcaine; and Latex  Home Medications   Prior to Admission medications   Medication Sig Start Date End Date Taking? Authorizing Provider  acetaminophen  (TYLENOL) 500 MG tablet Take 1-2 tablets (500-1,000 mg total) by mouth every 6 (six) hours as needed for mild pain, moderate pain, fever or headache. 12/18/14   Tora Kindred [place], PA-C  albuterol (PROVENTIL) (2.5 MG/3ML) 0.083% nebulizer solution Take 3 mLs (2.5 mg total) by nebulization every 4 (four) hours as needed for wheezing or shortness of breath. 12/18/14   Tora Kindred [place], PA-C  ALPRAZolam (XANAX) 0.25 MG tablet Take 1 tablet (0.25 mg total) by mouth 3 (three) times daily as needed for anxiety. 12/18/14   Stephani Police, PA-C  cholecalciferol (VITAMIN D) 1000 UNITS tablet Take 2 tablets (2,000 Units total) by mouth daily. 12/18/14   Tora Kindred [place], PA-C  clopidogrel (PLAVIX) 75 MG tablet Take 75 mg by mouth daily.    Historical Provider, MD  docusate sodium (COLACE) 100 MG capsule Take 1 capsule (100 mg total) by mouth 2 (two) times daily. 12/18/14   Tora Kindred [place], PA-C  donepezil (ARICEPT) 10 MG tablet Take 10 mg by mouth at bedtime. 11/26/14   Historical Provider, MD  feeding supplement, ENSURE COMPLETE, (ENSURE COMPLETE) LIQD Take 237 mLs by mouth 2 (two) times daily between meals. 12/18/14   Tora Kindred [place], PA-C  fentaNYL (DURAGESIC - DOSED MCG/HR) 12 MCG/HR Place 1 patch (12.5 mcg total) onto the skin every 3 (three) days. 12/18/14   Tora Kindred [place], PA-C  gabapentin (NEURONTIN) 300 MG capsule Take 300 mg by mouth 2 (two) times daily.  Historical Provider, MD  glimepiride (AMARYL) 4 MG tablet Take 4 mg by mouth daily with breakfast.    Historical Provider, MD  iron polysaccharides (NIFEREX) 150 MG capsule Take 1 capsule (150 mg total) by mouth daily. 12/18/14   Tora Kindred [place], PA-C  methocarbamol (ROBAXIN) 500 MG tablet Take 1 tablet (500 mg total) by mouth every 6 (six) hours as needed for muscle spasms. 12/18/14   Tora Kindred [place], PA-C  mometasone-formoterol (DULERA) 100-5 MCG/ACT AERO Inhale 2 puffs into the lungs 2 (two) times daily. 12/18/14   Stephani Police, PA-C  Multiple Vitamin (MULTIVITAMIN  WITH MINERALS) TABS tablet Take 1 tablet by mouth daily. 12/18/14   Tora Kindred [place], PA-C  pioglitazone (ACTOS) 45 MG tablet Take 45 mg by mouth at bedtime.     Historical Provider, MD  sitaGLIPtin (JANUVIA) 100 MG tablet Take 100 mg by mouth at bedtime.     Historical Provider, MD  tiotropium (SPIRIVA) 18 MCG inhalation capsule Place 1 capsule (18 mcg total) into inhaler and inhale daily. 12/18/14   Stephani Police, PA-C  Vitamin D, Ergocalciferol, (DRISDOL) 50000 UNITS CAPS capsule Take 1 capsule (50,000 Units total) by mouth every 7 (seven) days. 12/18/14   Marianne L [place], PA-C   BP 124/47 mmHg  Pulse 80  Temp(Src) 98.8 F (37.1 C) (Oral)  Resp 17  SpO2 97% Physical Exam  Constitutional: He is oriented to person, place, and time. He appears well-developed and well-nourished.  HENT:  Head: Normocephalic and atraumatic.  Eyes: Conjunctivae and EOM are normal.  Neck: Normal range of motion. Neck supple.  Cardiovascular: Normal rate, regular rhythm and normal heart sounds.   Pulmonary/Chest: Effort normal. No respiratory distress. He has rales (lll).  Abdominal: He exhibits no distension. There is no tenderness. There is no rebound and no guarding.  Musculoskeletal: Normal range of motion.       Right lower leg: He exhibits no tenderness and no edema.       Left lower leg: He exhibits no tenderness and no edema.  Neurological: He is alert and oriented to person, place, and time.  Skin: Skin is warm and dry.  Vitals reviewed.   ED Course  Procedures (including critical care time) Labs Review Labs Reviewed  CBC WITH DIFFERENTIAL/PLATELET - Abnormal; Notable for the following:    WBC 13.1 (*)    RBC 2.89 (*)    Hemoglobin 9.0 (*)    HCT 28.7 (*)    Neutrophils Relative % 86 (*)    Neutro Abs 11.2 (*)    Lymphocytes Relative 6 (*)    All other components within normal limits  COMPREHENSIVE METABOLIC PANEL - Abnormal; Notable for the following:    BUN 41 (*)    Creatinine, Ser 1.69  (*)    Total Protein 5.8 (*)    Albumin 2.4 (*)    GFR calc non Af Amer 38 (*)    GFR calc Af Amer 44 (*)    All other components within normal limits  CULTURE, BLOOD (ROUTINE X 2)  CULTURE, BLOOD (ROUTINE X 2)  URINE CULTURE  URINALYSIS, ROUTINE W REFLEX MICROSCOPIC  SODIUM, URINE, RANDOM  CREATININE, URINE, RANDOM    Imaging Review Dg Chest 2 View  01/07/2015   CLINICAL DATA:  Low grade fever. Elevated D-dimer. Weakness and confusion.  EXAM: CHEST  2 VIEW  COMPARISON:  12/17/2014 and 08/29/2007  FINDINGS: There is a dense consolidative infiltrate in the posterior medial aspect of the left lower lobe.  There is a tiny right pleural effusion. Pulmonary vascularity is normal. Diffuse osteopenia with with multiple compression deformities in the thoracic spine, increased since the prior exam.  IMPRESSION: Left lower lobe pneumonia. Tiny right effusion. Interval thoracic compression fractures since 2008.   Electronically Signed   By: Geanie CooleyJim  Maxwell M.D.   On: 01/07/2015 13:34   Ct Angio Chest Pe W/cm &/or Wo Cm  01/07/2015   CLINICAL DATA:  Fever, elevated D-dimer.  EXAM: CT ANGIOGRAPHY CHEST WITH CONTRAST  TECHNIQUE: Multidetector CT imaging of the chest was performed using the standard protocol during bolus administration of intravenous contrast. Multiplanar CT image reconstructions and MIPs were obtained to evaluate the vascular anatomy.  CONTRAST:  80mL OMNIPAQUE IOHEXOL 350 MG/ML SOLN  COMPARISON:  Chest radiograph of same day.  FINDINGS: Severe wedge compression deformity of mid thoracic vertebral body is noted which most likely is old. No pneumothorax is noted. Large left lower lobe airspace opacity is noted concerning for pneumonia with mild associated pleural effusion. 6 mm nodule is noted anteriorly in right lower lobe along major fissure best seen on image number 47 of series 6. There is no evidence of pulmonary embolus. There is no evidence of thoracic aortic dissection or aneurysm. Great  vessels are widely patent. No mediastinal mass or adenopathy is noted. No significant abnormality seen in the visualized portion of the upper abdomen. No significant osseous abnormality is noted.  Review of the MIP images confirms the above findings.  IMPRESSION: No evidence of pulmonary embolus.  Large left lower lobe airspace opacity is noted concerning for pneumonia with mild associated pleural effusion.  Probable old severe compression fracture seen involving mid thoracic spine.   Electronically Signed   By: Roque LiasJames  Green M.D.   On: 01/07/2015 16:30     EKG Interpretation None      MDM   Final diagnoses:  HCAP (healthcare-associated pneumonia)    [age] y.o. male with pertinent PMH of CVA, DM, recent admission after R hip, iliac, and wrist fx presents with fever to 103, cough and positive ddimer.  No recent leg swelling, dyspnea, hemoptysis.  On arrival today vitals signs and physical exam as above. Chest x-ray with pneumonia. I discussed the results with family and with the patient's primary care provider, they were particularly concerned about a pulmonary embolism given the patient's prior stroke history. We discussed the risks of CT pulmonary angiography given the patient's baseline renal function and possibility of renal failure as a result, they still elected to receive a CT scan. This demonstrated pneumonia, no primary emboli. Likely source of positive d-dimer trauma and pneumonia.  Patient's blood pressure soft despite 1 L fluid. Admitted in stable condition..    I have reviewed all laboratory and imaging studies if ordered as above  1. HCAP (healthcare-associated pneumonia)         Mirian MoMatthew Gentry, MD 01/07/15 1736

## 2015-01-07 NOTE — Progress Notes (Signed)
Hypoglycemic Event  CBG: 52  Treatment: D50 IV 25 mL  Symptoms: None  Follow-up CBG: Time:1940* CBG Result:116  Possible Reasons for Event: Inadequate meal intake  Comments/MD notified:no    Johnathan [name], [name]  Remember to initiate Hypoglycemia Order Set & complete

## 2015-01-07 NOTE — H&P (Signed)
[facility] History and Physical                                                                                    Patient Demographics  Johnathan [name], is a 79 y.o. male  MRN: [id number]   DOB - [birth year]  Admit Date - 01/07/2015  Outpatient Primary MD for the patient is Kaleen Mask, MD   With History of -  Past Medical History  Diagnosis Date  . Diabetes mellitus without complication   . Stroke   . COPD (chronic obstructive pulmonary disease)   . Falls   . Vitamin D deficiency   . Acquired deformity of left upper arm   . Nicotine dependence   . Closed fracture of neck of right femur with routine healing   . Fracture of right acetabulum with routine healing   . Closed fracture of right forearm with routine healing       Past Surgical History  Procedure Laterality Date  . Intramedullary (im) nail intertrochanteric Right 2005    [name]     in for   Chief Complaint  Patient presents with  . Fever  . elevated d-dimer    elevated d-dimer     HPI  Johnathan [name]  is a 79 y.o. male,  79 year old male patient with past medical history of dementia, diabetes, prior CVA, COPD on chest x-ray and tobacco abuse. Recently discharged from this facility on 1/7 after an admission for an acetabular fracture with right radial fracture after fall at home. He was discharged to skilled nursing facility for ongoing physical therapy and occupational therapy. Patient was sent to the ER from the nursing facility after reports of fever of 203 with cough. In the ER patient had mild leukocytosis of 13,100, he was afebrile and blood pressure somewhat soft for him at 111/47. He was requiring oxygen due to hypoxemia. Chest x-ray was concerning for left lower lobe pneumonia with a small effusion. A d-dimer had been checked in the outpatient setting and was elevated so CT of the chest was obtained that revealed a large left lower lobe airspace opacity concerning for pneumonia and a mild  associated pleural effusion. Hospitalist service was asked to admit the patient for HCAP.  Review of Systems    In addition to the HPI above: Fever at least 24 hours No Headache, No changes with Vision or hearing, No problems swallowing food or Liquids, No Chest pain; productive Cough but patient swallowing expectorant, No Shortness of Breath, No Abdominal pain, No Nausea or Vomiting, Bowel movements are regular, No Blood in stool or Urine, No dysuria, but family and staff have noted decreased urinary output No new skin rashes or bruises, No new joints pains-aches; previous pain from fractures has now resolved No new weakness, tingling, numbness in any extremity, No recent weight gain or loss; patient with anorexia for several days No polyuria, polydypsia or polyphagia, No significant Mental Stressors.  A full 10 point Review of Systems was done, except as stated above, all other Review of Systems were negative.   Social History History  Substance Use Topics  . Smoking status: Current Every Day Smoker --  2.00 packs/day    Types: Cigarettes  . Smokeless tobacco: Not on file  . Alcohol Use: No     Family History No family history on file.   Prior to Admission medications   Medication Sig Start Date End Date Taking? Authorizing Provider  acetaminophen (TYLENOL) 500 MG tablet Take 1-2 tablets (500-1,000 mg total) by mouth every 6 (six) hours as needed for mild pain, moderate pain, fever or headache. 12/18/14   Tora Kindred York, PA-C  albuterol (PROVENTIL) (2.5 MG/3ML) 0.083% nebulizer solution Take 3 mLs (2.5 mg total) by nebulization every 4 (four) hours as needed for wheezing or shortness of breath. 12/18/14   Tora Kindred York, PA-C  ALPRAZolam (XANAX) 0.25 MG tablet Take 1 tablet (0.25 mg total) by mouth 3 (three) times daily as needed for anxiety. 12/18/14   Stephani Police, PA-C  cholecalciferol (VITAMIN D) 1000 UNITS tablet Take 2 tablets (2,000 Units total) by mouth daily. 12/18/14    Tora Kindred York, PA-C  clopidogrel (PLAVIX) 75 MG tablet Take 75 mg by mouth daily.    Historical Provider, MD  docusate sodium (COLACE) 100 MG capsule Take 1 capsule (100 mg total) by mouth 2 (two) times daily. 12/18/14   Tora Kindred York, PA-C  donepezil (ARICEPT) 10 MG tablet Take 10 mg by mouth at bedtime. 11/26/14   Historical Provider, MD  feeding supplement, ENSURE COMPLETE, (ENSURE COMPLETE) LIQD Take 237 mLs by mouth 2 (two) times daily between meals. 12/18/14   Tora Kindred York, PA-C  fentaNYL (DURAGESIC - DOSED MCG/HR) 12 MCG/HR Place 1 patch (12.5 mcg total) onto the skin every 3 (three) days. 12/18/14   Tora Kindred York, PA-C  gabapentin (NEURONTIN) 300 MG capsule Take 300 mg by mouth 2 (two) times daily.     Historical Provider, MD  glimepiride (AMARYL) 4 MG tablet Take 4 mg by mouth daily with breakfast.    Historical Provider, MD  iron polysaccharides (NIFEREX) 150 MG capsule Take 1 capsule (150 mg total) by mouth daily. 12/18/14   Tora Kindred York, PA-C  methocarbamol (ROBAXIN) 500 MG tablet Take 1 tablet (500 mg total) by mouth every 6 (six) hours as needed for muscle spasms. 12/18/14   Tora Kindred York, PA-C  mometasone-formoterol (DULERA) 100-5 MCG/ACT AERO Inhale 2 puffs into the lungs 2 (two) times daily. 12/18/14   Stephani Police, PA-C  Multiple Vitamin (MULTIVITAMIN WITH MINERALS) TABS tablet Take 1 tablet by mouth daily. 12/18/14   Tora Kindred York, PA-C  pioglitazone (ACTOS) 45 MG tablet Take 45 mg by mouth at bedtime.     Historical Provider, MD  sitaGLIPtin (JANUVIA) 100 MG tablet Take 100 mg by mouth at bedtime.     Historical Provider, MD  tiotropium (SPIRIVA) 18 MCG inhalation capsule Place 1 capsule (18 mcg total) into inhaler and inhale daily. 12/18/14   Stephani Police, PA-C  Vitamin D, Ergocalciferol, (DRISDOL) 50000 UNITS CAPS capsule Take 1 capsule (50,000 Units total) by mouth every 7 (seven) days. 12/18/14   Stephani Police, PA-C    Allergies  Allergen Reactions  . Hydrocodone  Other (See Comments)    ineffective  . Solarcaine [Benzocaine] Other (See Comments)    Severe burning  . Latex Itching and Rash    Physical Exam  Vitals  Blood pressure 124/47, pulse 80, temperature 98.8 F (37.1 C), temperature source Oral, resp. rate 17, SpO2 97 %.   General:  lying in bed in NAD,   Psych:  Normal affect, jocular, Awake Alert,  Oriented to name. Very pleasant, Seems hard of hearing  Neuro:   No F.N deficits, ALL C.Nerves Intact, estimated Strength 5/5 all 4 extremities-RLE somewhat limited by pain, Sensation intact all 4 extremities.  ENT:  Ears and Eyes appear Normal, Conjunctivae clear, PERRLA. Very dry Oral Mucosa.  Neck:  Supple Neck, No JVD, No cervical lymphadenopathy appreciated  Respiratory:  Symmetrical Chest wall movement, marked decreased air movement bilaterally, CTAB without wheeze, rhonchi or crackles  Cardiac:  RRR, No Gallops, Rubs or Murmurs, No Parasternal Heave. Noted left carotid bruit  Abdomen:  Positive Bowel Sounds, Abdomen Soft, Non tender, No organomegaly appreciiated, no abdominal bruits  Skin:  No Cyanosis, Normal Skin Turgor, No Skin Rash or Bruise.  Extremities:  Symmetrical,  joints appear normal , no effusions   Data Review  CBC  Recent Labs Lab 01/07/15 1155  WBC 13.1*  HGB 9.0*  HCT 28.7*  PLT 157  MCV 99.3  MCH 31.1  MCHC 31.4  RDW 14.3  LYMPHSABS 0.8  MONOABS 0.8  EOSABS 0.2  BASOSABS 0.0   ------------------------------------------------------------------------------------------------------------------  Chemistries   Recent Labs Lab 01/07/15 1155  NA 137  K 4.8  CL 100  CO2 31  GLUCOSE 75  BUN 41*  CREATININE 1.69*  CALCIUM 8.5  AST 17  ALT 8  ALKPHOS 105  BILITOT 0.6   ------------------------------------------------------------------------------------------------------------------ CrCl cannot be calculated (Unknown ideal  weight.). ------------------------------------------------------------------------------------------------------------------ No results for input(s): TSH, T4TOTAL, T3FREE, THYROIDAB in the last 72 hours.  Invalid input(s): FREET3   Coagulation profile No results for input(s): INR, PROTIME in the last 168 hours. ------------------------------------------------------------------------------------------------------------------- No results for input(s): DDIMER in the last 72 hours. -------------------------------------------------------------------------------------------------------------------  Cardiac Enzymes No results for input(s): CKMB, TROPONINI, MYOGLOBIN in the last 168 hours.  Invalid input(s): CK ------------------------------------------------------------------------------------------------------------------ Invalid input(s): POCBNP   ---------------------------------------------------------------------------------------------------------------  Urinalysis    Component Value Date/Time   COLORURINE YELLOW 12/14/2014 0545   APPEARANCEUR CLEAR 12/14/2014 0545   LABSPEC >1.030* 12/14/2014 0545   PHURINE 5.5 12/14/2014 0545   GLUCOSEU NEGATIVE 12/14/2014 0545   HGBUR NEGATIVE 12/14/2014 0545   BILIRUBINUR SMALL* 12/14/2014 0545   KETONESUR NEGATIVE 12/14/2014 0545   PROTEINUR 30* 12/14/2014 0545   UROBILINOGEN 0.2 12/14/2014 0545   NITRITE NEGATIVE 12/14/2014 0545   LEUKOCYTESUR NEGATIVE 12/14/2014 0545    ----------------------------------------------------------------------------------------------------------------  Imaging results:   Dg Chest 1 View  12/13/2014   CLINICAL DATA:  Right hip pain since a fall earlier today.  EXAM: CHEST - 1 VIEW  COMPARISON:  08/29/2007  FINDINGS: Heart size is normal. Pulmonary vascularity is at the upper limits of normal. Lungs are clear. There is no compression fracture in the mid thoracic spine.  IMPRESSION: No acute abnormalities  of the chest.   Electronically Signed   By: Geanie CooleyJim  Maxwell M.D.   On: 12/13/2014 18:10   Dg Chest 2 View  01/07/2015   CLINICAL DATA:  Low grade fever. Elevated D-dimer. Weakness and confusion.  EXAM: CHEST  2 VIEW  COMPARISON:  12/17/2014 and 08/29/2007  FINDINGS: There is a dense consolidative infiltrate in the posterior medial aspect of the left lower lobe. There is a tiny right pleural effusion. Pulmonary vascularity is normal. Diffuse osteopenia with with multiple compression deformities in the thoracic spine, increased since the prior exam.  IMPRESSION: Left lower lobe pneumonia. Tiny right effusion. Interval thoracic compression fractures since 2008.   Electronically Signed   By: Geanie CooleyJim  Maxwell M.D.   On: 01/07/2015 13:34   Dg Wrist Complete Right  12/13/2014  CLINICAL DATA:  Status post fall today with pain.  EXAM: RIGHT WRIST - COMPLETE 3+ VIEW  COMPARISON:  None.  FINDINGS: There is diffuse osteopenia. There is minimal displaced fracture of the distal radius. There is probable nondisplaced fracture of distal ulna. There is no dislocation.  IMPRESSION: Fracture of distal radius. Probable nondisplaced fracture of distal ulna.   Electronically Signed   By: Sherian Rein M.D.   On: 12/13/2014 18:09   Dg Hip Bilateral W/pelvis  12/13/2014   CLINICAL DATA:  Acute right hip pain after fall today.  EXAM: BILATERAL HIP WITH PELVIS - 4+ VIEW  COMPARISON:  None.  FINDINGS: Status post surgical fixation of old proximal right femoral fracture. Diffuse osteopenia is noted. Mildly displaced fracture of the medial acetabulum is noted. Mild degenerative change of the left hip is noted.  IMPRESSION: Mildly displaced fracture of the medial acetabulum. CT scan is recommended for further evaluation.   Electronically Signed   By: Roque Lias M.D.   On: 12/13/2014 18:14   Ct Head Wo Contrast  12/13/2014   CLINICAL DATA:  Fall today.  EXAM: CT HEAD WITHOUT CONTRAST  CT CERVICAL SPINE WITHOUT CONTRAST  TECHNIQUE:  Multidetector CT imaging of the head and cervical spine was performed following the standard protocol without intravenous contrast. Multiplanar CT image reconstructions of the cervical spine were also generated.  COMPARISON:  MRI brain 08/20/2007  FINDINGS: CT HEAD FINDINGS  A remote left PCA territory infarct it is noted. Other areas of remote encephalomalacia include in the anterior left frontal lobe than the posterior right frontal lobe and parietal lobe are otherwise stable. Diffuse white matter changes are evident. Remote lacunar infarcts are noted in the basal ganglia bilaterally. Extensive remote lacunar infarcts are present within both cerebellar hemispheres.  No acute cortical infarct, hemorrhage, or mass lesion is present. The ventricles are proportionate to the degree of atrophy with ex vacuo dilation of the left lateral ventricle in particular. No significant extraaxial fluid collection is present.  The paranasal sinuses and mastoid air cells are clear. The calvarium is intact. Atherosclerotic calcifications are present within the cavernous internal carotid arteries bilaterally.  CT CERVICAL SPINE FINDINGS  Leftward curvature of the cervical spine is noted. Vertebral body heights AP alignment are maintained. No acute fracture or traumatic subluxation is evident. Mild degenerative changes are noted at the C1-2 arch. The soft tissues of the neck demonstrate atherosclerotic calcifications bilaterally, worse on the right. No other focal lesions are evident. Severe emphysematous changes are present.  IMPRESSION: 1. Multiple remote infarcts as described. 2. Remote lacunar infarcts within the basal ganglia and cerebellum. 3. No acute intracranial abnormality or evidence for acute trauma. 4. Levoconvex curvature of the cervical spine without evidence for acute trauma. 5. Severe emphysematous changes.   Electronically Signed   By: Gennette Pac M.D.   On: 12/13/2014 20:41   Ct Angio Chest Pe W/cm &/or Wo  Cm  01/07/2015   CLINICAL DATA:  Fever, elevated D-dimer.  EXAM: CT ANGIOGRAPHY CHEST WITH CONTRAST  TECHNIQUE: Multidetector CT imaging of the chest was performed using the standard protocol during bolus administration of intravenous contrast. Multiplanar CT image reconstructions and MIPs were obtained to evaluate the vascular anatomy.  CONTRAST:  80mL OMNIPAQUE IOHEXOL 350 MG/ML SOLN  COMPARISON:  Chest radiograph of same day.  FINDINGS: Severe wedge compression deformity of mid thoracic vertebral body is noted which most likely is old. No pneumothorax is noted. Large left lower lobe airspace opacity is noted concerning  for pneumonia with mild associated pleural effusion. 6 mm nodule is noted anteriorly in right lower lobe along major fissure best seen on image number 47 of series 6. There is no evidence of pulmonary embolus. There is no evidence of thoracic aortic dissection or aneurysm. Great vessels are widely patent. No mediastinal mass or adenopathy is noted. No significant abnormality seen in the visualized portion of the upper abdomen. No significant osseous abnormality is noted.  Review of the MIP images confirms the above findings.  IMPRESSION: No evidence of pulmonary embolus.  Large left lower lobe airspace opacity is noted concerning for pneumonia with mild associated pleural effusion.  Probable old severe compression fracture seen involving mid thoracic spine.   Electronically Signed   By: Roque Lias M.D.   On: 01/07/2015 16:30   Ct Cervical Spine Wo Contrast  12/13/2014   CLINICAL DATA:  Fall today.  EXAM: CT HEAD WITHOUT CONTRAST  CT CERVICAL SPINE WITHOUT CONTRAST  TECHNIQUE: Multidetector CT imaging of the head and cervical spine was performed following the standard protocol without intravenous contrast. Multiplanar CT image reconstructions of the cervical spine were also generated.  COMPARISON:  MRI brain 08/20/2007  FINDINGS: CT HEAD FINDINGS  A remote left PCA territory infarct it is  noted. Other areas of remote encephalomalacia include in the anterior left frontal lobe than the posterior right frontal lobe and parietal lobe are otherwise stable. Diffuse white matter changes are evident. Remote lacunar infarcts are noted in the basal ganglia bilaterally. Extensive remote lacunar infarcts are present within both cerebellar hemispheres.  No acute cortical infarct, hemorrhage, or mass lesion is present. The ventricles are proportionate to the degree of atrophy with ex vacuo dilation of the left lateral ventricle in particular. No significant extraaxial fluid collection is present.  The paranasal sinuses and mastoid air cells are clear. The calvarium is intact. Atherosclerotic calcifications are present within the cavernous internal carotid arteries bilaterally.  CT CERVICAL SPINE FINDINGS  Leftward curvature of the cervical spine is noted. Vertebral body heights AP alignment are maintained. No acute fracture or traumatic subluxation is evident. Mild degenerative changes are noted at the C1-2 arch. The soft tissues of the neck demonstrate atherosclerotic calcifications bilaterally, worse on the right. No other focal lesions are evident. Severe emphysematous changes are present.  IMPRESSION: 1. Multiple remote infarcts as described. 2. Remote lacunar infarcts within the basal ganglia and cerebellum. 3. No acute intracranial abnormality or evidence for acute trauma. 4. Levoconvex curvature of the cervical spine without evidence for acute trauma. 5. Severe emphysematous changes.   Electronically Signed   By: Gennette Pac M.D.   On: 12/13/2014 20:41   Ct Pelvis Wo Contrast  12/13/2014   CLINICAL DATA:  Status post fall today; persistent moderate right hip pain status post fall. CT recommended for further evaluation of right-sided pelvic fracture. Initial encounter.  EXAM: CT PELVIS WITHOUT CONTRAST  TECHNIQUE: Multidetector CT imaging of the pelvis was performed following the standard protocol  without intravenous contrast.  COMPARISON:  Pelvis and bilateral hip radiographs performed earlier today at 5:31 p.m.  FINDINGS: There appears to be a T-shaped fracture through the right acetabulum; the transverse component is thought to be juxtatectal in nature, with perhaps 5 mm of separation. The anterior component demonstrates only minimal displacement, measuring perhaps 3 mm, and extends into the anterior wall more inferiorly, with partial disruption at the lateral aspect of the right superior pubic ramus. There is a minimally displaced fracture of the right inferior pubic  ramus.  There also appears to be an avulsion fracture involving the right anterior inferior iliac spine, with mild displacement. No additional fractures are seen.  The sacroiliac joints are grossly unremarkable. The pubic symphysis is grossly unremarkable in appearance. The patient's right femoral hardware is grossly unremarkable in appearance, though difficult to fully characterize. No significant joint space narrowing is seen. There is significant chronic compression deformity of vertebral body L4.  Visualized small and large bowel loops are grossly unremarkable. Diffuse calcification is seen along the distal abdominal aorta and its branches. Mild soft tissue hematoma is noted along the right pelvic sidewall, tracking minimally into the right lower quadrant in a retroperitoneal distribution, with mild leftward displacement of the bladder.  IMPRESSION: 1. T-shaped fracture of the right acetabulum; the transverse component is thought to be juxtatectal in nature. The anterior component extends into the anterior wall more inferiorly, with partial disruption at the lateral aspect of the right superior pubic ramus. Minimally displaced fracture of the right inferior pubic ramus. Displacement measures perhaps 5 mm at the transverse component and 3 mm at the anterior component. 2. Avulsion fracture involving the right anterior inferior iliac spine,  with mild displacement. 3. Associated mild soft tissue hematoma along the right pelvic sidewall, tracking minimally into the right lower quadrant in a retroperitoneal distribution. 4. Diffuse calcification along the distal abdominal aorta and its branches. 5. Significant chronic compression deformity of vertebral body L4.   Electronically Signed   By: Roanna Raider M.D.   On: 12/13/2014 21:29   Dg Pelvis Comp Min 3v  12/16/2014   CLINICAL DATA:  Fall a few months ago with persistent right hip pain.  EXAM: JUDET PELVIS - 3+ VIEW  COMPARISON:  12/13/2014 and CT 12/13/2014  FINDINGS: There is diffuse decreased bone mineralization. Fixation hardware intact over the right proximal femur. Moderate bilateral osteoarthritic changes of the hips. Continued evidence of patient's known right acetabular fracture. Evidence of patient's known right inferior pubic ramus fracture. Degenerate changes of the spine. Calcified plaque over the abdominal aorta and iliac arteries. Remainder of the exam is unchanged.  IMPRESSION: No significant change in patient's known right acetabular fracture and right inferior pubis ramus fracture.  Moderate diffuse decreased bone mineralization with moderate bilateral osteoarthritis of the hips.   Electronically Signed   By: Elberta Fortis M.D.   On: 12/16/2014 11:34   Dg Chest Port 1 View  12/17/2014   CLINICAL DATA:  Shortness of breath, pneumonia  EXAM: PORTABLE CHEST - 1 VIEW  COMPARISON:  12/14/2014  FINDINGS: Cardiac shadow is stable. The lungs are again well aerated. Given some limitation secondary to patient rotation no focal infiltrate or sizable effusion is seen. No acute bony abnormality is noted.  IMPRESSION: No active disease.   Electronically Signed   By: Alcide Clever M.D.   On: 12/17/2014 10:39   Dg Chest Port 1 View  12/14/2014   CLINICAL DATA:  Shortness of breath. Patient fell in broke right pelvis.  EXAM: PORTABLE CHEST - 1 VIEW  COMPARISON:  12/13/2014  FINDINGS: Shallow  inspiration. Heart size and pulmonary vascularity are normal for technique. Suggestion of mild atelectasis in the left lung base. No blunting of costophrenic angles. No pneumothorax. Calcified and tortuous aorta. Degenerative changes in the spine and shoulders.  IMPRESSION: Suggestion of slight atelectasis in the left lung base.   Electronically Signed   By: Burman Nieves M.D.   On: 12/14/2014 04:38   Dg Knee Complete 4 Views Right  12/15/2014   CLINICAL DATA:  Right knee pain since a fall several days ago. Acute right acetabular fracture  EXAM: RIGHT KNEE - COMPLETE 4+ VIEW  COMPARISON:  Report of radiographs dated 01/12/2004  FINDINGS: There is diffuse osteopenia. No joint space narrowing. No fracture or dislocation or joint effusion.  IMPRESSION: No acute abnormality.   Electronically Signed   By: Geanie Cooley M.D.   On: 12/15/2014 16:20   Dg Abd 2 Views  12/15/2014   CLINICAL DATA:  Left lower quadrant abdominal pain. Acute right hip fracture.  EXAM: ABDOMEN - 2 VIEW  COMPARISON:  CT scan of the pelvis dated 12/13/2014 and radiograph dated 12/13/1998 extend  FINDINGS: Bowel gas pattern is normal. No free air or free fluid. Old compression fracture of L4. Extensive vascular calcification in the abdomen and pelvis. Right acetabular fracture is is quite subtle on the AP view.  IMPRESSION: Benign appearing abdomen except for right acetabular fracture and old L4 fracture.   Electronically Signed   By: Geanie Cooley M.D.   On: 12/15/2014 16:24      Assessment & Plan  Active Problems:   Acute respiratory failure with hypoxia/HCAP/COPD -Large infiltrate on CT with associated reactive pleural effusion -Begin empiric broad-spectrum antibiotics -Flutter valve every 2 hours when necessary while awake -Continue home nebulizers -Not actively wheezing so doubt acute COPD exacerbation at this time -cont supportive care with oxygen -Check blood cultures and attempt to obtain expectorated sputum  culture -watch for progressive hypoxemia and worsening symptoms with hydration;continuous pulse ox -Check respiratory viral panel and influenza antigen    ARF on CKD 3 -baseline renal function 17/1.24; today 41/1.69 -Suspect prerenal in setting of acute respiratory infection -Was not on offending medications prior to admission -IV fluid at 75 mL per hour -Repeat labs in a.m.    Dehydration -IV fluids as above -Check FENa    Diabetes mellitus -Hold home Amaryl, Actos and Januvia -Begin sliding scale insulin -Hemoglobin A1c 5.9 on 1/6   Abnormal TSH -Last admission TSH was 6.576 with normal free T4 -This needs to be repeated in about 6 weeks    Dementia -Resume home medications -Watch for sundowning and patient an unfamiliar surroundings with acute infection    Anemia/thrombocytopenia -Baseline hemoglobin 9.9 -Current hemoglobin stable -On Niferex prior to admission -Platelets were low but trending upward during last admission noting when last checked 81,000; today at presentation platelets are 157,000    Recent Acetabular fracture and Right radial fracture -Nonweight bearing right leg -cont preadmission pain medications -PT/OT evaluation    Low testosterone level -Documented last admission -Unclear significance or efficacy and continuing workup an elderly male patient    History of CVA  -Continue Plavix       DVT Prophylaxis Heparin    Family Communication:  Wife and daughter at bedside    Code Status:  Full   Likely DC to  skilled nursing facility  Condition:  Stable  Time spent in minutes : 60    Orvan Papadakis L. ANP on 01/07/2015 at 5:49 PM  Between 7am to 7pm - Pager; [phone]  After 7pm go to [email or web address] - password [email or web address]  And look for the night coverage person covering me after hours  [facility] Office  [phone]

## 2015-01-07 NOTE — Progress Notes (Addendum)
ANTIBIOTIC CONSULT NOTE - INITIAL  Pharmacy Consult for Vancomycin  Indication: Pneumonia  Allergies  Allergen Reactions  . Hydrocodone Other (See Comments)    ineffective  . Solarcaine [Benzocaine] Other (See Comments)    Severe burning  . Latex Itching and Rash    Patient Measurements:  Vital Signs: Temp: 98.8 F (37.1 C) (01/27 1136) Temp Source: Oral (01/27 1136) BP: 131/41 mmHg (01/27 1612) Pulse Rate: 75 (01/27 1612) Intake/Output from previous day:   Intake/Output from this shift:    Labs:  Recent Labs  01/07/15 1155  WBC 13.1*  HGB 9.0*  PLT 157  CREATININE 1.69*   CrCl cannot be calculated (Unknown ideal weight.). No results for input(s): VANCOTROUGH, VANCOPEAK, VANCORANDOM, GENTTROUGH, GENTPEAK, GENTRANDOM, TOBRATROUGH, TOBRAPEAK, TOBRARND, AMIKACINPEAK, AMIKACINTROU, AMIKACIN in the last 72 hours.   Microbiology: No results found for this or any previous visit (from the past 720 hour(s)).  Medical History: Past Medical History  Diagnosis Date  . Diabetes mellitus without complication   . Stroke   . COPD (chronic obstructive pulmonary disease)   . Falls   . Vitamin D deficiency   . Acquired deformity of left upper arm   . Nicotine dependence   . Closed fracture of neck of right femur with routine healing   . Fracture of right acetabulum with routine healing   . Closed fracture of right forearm with routine healing     Medications:   (Not in a hospital admission) Scheduled:   Infusions:  . ceFEPime (MAXIPIME) IV 1 g (01/07/15 1650)  . sodium chloride     Assessment:  78 YOM from [facility] w/ PMH of diabetes mellitus, CVA, suspected COPD, history of right femur fracture 2005, history of left wrist drop since childhood dementia presenting to [facility] on 01/07/15.  Patient was recently admitted for fall, resulting in R hip fx and R wrist fx.  Patient was started on Cipro 01/06/15.  Pharmacy has been consulted to dose vancomycin for pneumonia (likely  HCAP).  Cefepime 1 g IV x 1 in ED  WBC up 13.1, afebrile, HR & RR wnl.   CrCl 32.3 mL/min, Scr 1.69  Goal of Therapy:  Vancomycin trough level 15-20 mcg/ml  Plan:  - Vancomycin 1250 mg IV x 1 - Vancomycin 1000 mg IV Q24H - Monitor for clinical efficacy and trough levels when appropriate - Monitor renal function - Follow up cultures  Johnathan [name], Pharm. D. Clinical Pharmacy Resident Pager: [phone] Ph: [phone] 01/07/2015 5:06 PM   Addendum: Pharmacy consulted to adjust abx for renal fxn.  Cefepime 1 gm IV q8h ordered for HCAP.  Creat 1.69, creat cl 32 ml/min. First dose of cefepime 1 gm given in ED at 1650 Plan Cefepime 2 gm IV q24 to start 1/28 at 0600 am  Johnathan [name], Pharm.D. [phone] 01/07/2015 6:53 PM

## 2015-01-08 ENCOUNTER — Inpatient Hospital Stay (HOSPITAL_COMMUNITY): Payer: Medicare Other

## 2015-01-08 ENCOUNTER — Encounter (HOSPITAL_COMMUNITY): Payer: Self-pay | Admitting: *Deleted

## 2015-01-08 DIAGNOSIS — J9601 Acute respiratory failure with hypoxia: Secondary | ICD-10-CM

## 2015-01-08 LAB — BASIC METABOLIC PANEL
ANION GAP: 4 — AB (ref 5–15)
BUN: 26 mg/dL — ABNORMAL HIGH (ref 6–23)
CALCIUM: 8.1 mg/dL — AB (ref 8.4–10.5)
CHLORIDE: 106 mmol/L (ref 96–112)
CO2: 29 mmol/L (ref 19–32)
Creatinine, Ser: 1.31 mg/dL (ref 0.50–1.35)
GFR calc non Af Amer: 51 mL/min — ABNORMAL LOW (ref >90)
GFR, EST AFRICAN AMERICAN: 59 mL/min — AB (ref >90)
Glucose, Bld: 66 mg/dL — ABNORMAL LOW (ref 70–99)
POTASSIUM: 4.3 mmol/L (ref 3.5–5.1)
SODIUM: 139 mmol/L (ref 135–145)

## 2015-01-08 LAB — GLUCOSE, CAPILLARY
Glucose-Capillary: 72 mg/dL (ref 70–99)
Glucose-Capillary: 122 mg/dL — ABNORMAL HIGH (ref 70–99)
Glucose-Capillary: 137 mg/dL — ABNORMAL HIGH (ref 70–99)
Glucose-Capillary: 167 mg/dL — ABNORMAL HIGH (ref 70–99)

## 2015-01-08 LAB — URINALYSIS, ROUTINE W REFLEX MICROSCOPIC
Bilirubin Urine: NEGATIVE
Glucose, UA: NEGATIVE mg/dL
Hgb urine dipstick: NEGATIVE
Ketones, ur: NEGATIVE mg/dL
LEUKOCYTES UA: NEGATIVE
Nitrite: NEGATIVE
Protein, ur: NEGATIVE mg/dL
SPECIFIC GRAVITY, URINE: 1.025 (ref 1.005–1.030)
Urobilinogen, UA: 1 mg/dL (ref 0.0–1.0)
pH: 6.5 (ref 5.0–8.0)

## 2015-01-08 LAB — CBC
HCT: 26.7 % — ABNORMAL LOW (ref 39.0–52.0)
Hemoglobin: 8.4 g/dL — ABNORMAL LOW (ref 13.0–17.0)
MCH: 31.7 pg (ref 26.0–34.0)
MCHC: 31.5 g/dL (ref 30.0–36.0)
MCV: 100.8 fL — AB (ref 78.0–100.0)
Platelets: 148 10*3/uL — ABNORMAL LOW (ref 150–400)
RBC: 2.65 MIL/uL — AB (ref 4.22–5.81)
RDW: 14.5 % (ref 11.5–15.5)
WBC: 8.4 10*3/uL (ref 4.0–10.5)

## 2015-01-08 LAB — LEGIONELLA ANTIGEN, URINE

## 2015-01-08 LAB — SODIUM, URINE, RANDOM: Sodium, Ur: 29 mmol/L

## 2015-01-08 LAB — STREP PNEUMONIAE URINARY ANTIGEN: Strep Pneumo Urinary Antigen: POSITIVE — AB

## 2015-01-08 LAB — CREATININE, URINE, RANDOM: Creatinine, Urine: 124.17 mg/dL

## 2015-01-08 MED ORDER — IPRATROPIUM-ALBUTEROL 0.5-2.5 (3) MG/3ML IN SOLN
3.0000 mL | Freq: Four times a day (QID) | RESPIRATORY_TRACT | Status: DC
  Administered 2015-01-08 (×2): 3 mL via RESPIRATORY_TRACT
  Filled 2015-01-08 (×2): qty 3

## 2015-01-08 MED ORDER — IPRATROPIUM-ALBUTEROL 0.5-2.5 (3) MG/3ML IN SOLN
3.0000 mL | Freq: Three times a day (TID) | RESPIRATORY_TRACT | Status: DC
  Administered 2015-01-09 (×2): 3 mL via RESPIRATORY_TRACT
  Filled 2015-01-08 (×2): qty 3

## 2015-01-08 NOTE — Progress Notes (Signed)
Inpatient Diabetes Program Recommendations  AACE/ADA: New Consensus Statement on Inpatient Glycemic Control (2013)  Target Ranges:  Prepandial:   less than 140 mg/dL      Peak postprandial:   less than 180 mg/dL (1-2 hours)      Critically ill patients:  140 - 180 mg/dL  Results for Johnathan [name], Johnathan [name] (MRN [id number]) as of 01/08/2015 07:35  Ref. Range 01/07/2015 11:55 01/08/2015 05:45  Glucose Latest Range: 70-99 mg/dL 75 66 (L)   Results for Johnathan [name], Johnathan [name] (MRN [id number]) as of 01/08/2015 07:35  Ref. Range 01/07/2015 18:50 01/07/2015 19:54 01/07/2015 23:13  Glucose-Capillary Latest Range: 70-99 mg/dL 52 (L) 401116 (H) 027161 (H)    Diabetes history: DM2 Outpatient Diabetes medications: Amaryl 4 mg QAM, Actos 45 mg QHS, Januvia 100 mg QHS Current orders for Inpatient glycemic control: Novolog 0-9 units TID with meals, Novolog 0-5 units QHS  Inpatient Diabetes Program Recommendations Oral Agents: Noted CBG dropped to 52 mg/dl yesterday and fasting glucose 66 mg/dl this morning. Lows are likely due to residual effect of outpatient oral DM medications. MD may want to consider re-evaluating outpatient DM medications at time of discharge especially since patient has dementia and low A1C (5.9% on 12/17/14).   Thanks, Orlando PennerMarie Caliope Ruppert, RN, MSN, CCRN, CDE Diabetes Coordinator Inpatient Diabetes Program [phone] (Team Pager) [phone] ([facility] office) [phone] [facility] office)

## 2015-01-08 NOTE — Progress Notes (Signed)
OT Cancellation Note  Patient Details Name: Johnathan [name] MRN: [id number] DOB: 13-Jun-1936   Cancelled Treatment:    Reason Eval/Treat Not Completed: OT screened, no needs identified, will sign off - Pt from SNF with plan to return to SNF.  Will defer OT eval to SNF.   Angelene GiovanniConarpe, Avyonna Wagoner M  Joriel Streety [place], OTR/L [phone]  01/08/2015, 11:47 AM

## 2015-01-08 NOTE — Progress Notes (Signed)
UR completed.  Kenzley Ke, RN BSN MHA CCM Trauma/Neuro ICU Case Manager [phone]  

## 2015-01-08 NOTE — Evaluation (Signed)
Physical Therapy Evaluation Patient Details Name: Johnathan [name] MRN: [id number] DOB: Feb 22, 1936 Today's Date: 01/08/2015   History of Present Illness  Pt is a 79 y.o. male adm due to fall resulting in R acetabulum fracture and Rt distal radius fx. From ortho standpoint pt is being treated non surgically. pt with hx of Lt wrist drop and dementia. Pt was d/c to SNF and returned with HCAP.   Clinical Impression  Pt admitted with above diagnosis. Pt currently with functional limitations due to the deficits listed below (see PT Problem List). At the time of PT eval pt was able to perform transfers with assistance, and some scooting activity with total assist. Pt requires increased cueing to maintain NWB status on the upper and lower extremities. Per wife, pt has been working towards standing activity at SNF however tolerance is low and they have been using a lift for OOB. Discussed need for lift with RN at this time. Pt will benefit from skilled PT to increase their independence and safety with mobility to allow discharge to the venue listed below.       Follow Up Recommendations SNF;Supervision/Assistance - 24 hour    Equipment Recommendations  None recommended by PT    Recommendations for Other Services       Precautions / Restrictions Precautions Precautions: Fall Restrictions Weight Bearing Restrictions: Yes RUE Weight Bearing: Non weight bearing (Through elbow only) RLE Weight Bearing: Non weight bearing      Mobility  Bed Mobility Overal bed mobility: Needs Assistance Bed Mobility: Sit to Supine Rolling: Min assist     Sit to supine: Min assist   General bed mobility comments: Pt was able to roll with assist to protect R side. Pt was cued for NWB status as pt was reaching for the bed rails to pull himself over. Assist to elevate LE's back into the bed, and [facility]   Transfers                 General transfer comment: Deferred - pt will require a  lift for OOB mobility at this time.  Ambulation/Gait                Stairs            Wheelchair Mobility    Modified Rankin (Stroke Patients Only)       Balance Overall balance assessment: History of Falls;Needs assistance Sitting-balance support: Feet supported;Single extremity supported Sitting balance-Leahy Scale: Poor Sitting balance - Comments: Pt able to hold upright sitting balance for short bouts, and then slumps over and rests on his LUE on the bed rail.                                     Pertinent Vitals/Pain Pain Assessment: Faces Faces Pain Scale: Hurts a little bit Pain Location: R hip with movement Pain Descriptors / Indicators: Guarding Pain Intervention(s): Limited activity within patient's tolerance;Monitored during session;Repositioned    Home Living Family/patient expects to be discharged to:: Skilled nursing facility Living Arrangements: Spouse/significant other                    Prior Function Level of Independence: Needs assistance   Gait / Transfers Assistance Needed: Pt has been getting up to w/c with the lift at SNF. Wife states that he has been working towards standing activity but [place] has been limited  so far.   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 Noted strength deficits in quads and ankle as well.     Cervical / Trunk Assessment: Normal  Communication   Communication: No difficulties  Cognition Arousal/Alertness: Awake/alert Behavior During  Therapy: Anxious Overall Cognitive Status: History of cognitive impairments - at baseline (Hx of dementia) Area of Impairment: Problem solving;Safety/judgement;Following commands;Memory     Memory: Decreased recall of precautions Following Commands: Follows one step commands with increased time Safety/Judgement: Decreased awareness of safety   Problem Solving: Decreased initiation;Slow processing;Requires verbal cues;Requires tactile cues      General Comments      Exercises        Assessment/Plan    PT Assessment Patient needs continued PT services  PT Diagnosis Difficulty walking;Generalized weakness   PT Problem List Decreased range of motion;Decreased strength;Decreased activity tolerance;Decreased balance;Decreased mobility;Decreased knowledge of use of DME;Decreased safety awareness;Decreased knowledge of precautions;Cardiopulmonary status limiting activity  PT Treatment Interventions     PT Goals (Current goals can be found in the Care Plan section) Acute Rehab PT Goals Patient Stated Goal: Return to rehab PT Goal Formulation: With patient/family Time For Goal Achievement: 01/22/15 Potential to Achieve Goals: Fair    Frequency Min 2X/week   Barriers to discharge Decreased caregiver support      Co-evaluation               End of Session Equipment Utilized During Treatment: Oxygen Activity Tolerance: Patient limited by fatigue Patient left: in bed;with call bell/phone within reach;with bed alarm set;with family/visitor present Nurse Communication: Mobility status;Need for lift equipment         Time: [phone] PT Time Calculation (min) (ACUTE ONLY): 30 min   Charges:   PT Evaluation $Initial PT Evaluation Tier I: 1 Procedure PT Treatments $Therapeutic Activity: 8-22 mins   PT G Codes:        Conni Slipper Jan 23, 2015, 8:42 AM  Conni Slipper, PT, DPT Acute Rehabilitation Services Pager: [phone]

## 2015-01-08 NOTE — Clinical Social Work Note (Signed)
CSW confirmed with [facility] admissions liaison, patient admitted from [facility]. Per admissions liaison, patient's wife has completed a bed hold at [facility]. Patient able to return to facility once medically stable for discharge.  CSW to continue to follow and assist with discharge planning needs.  Marcelline Deistmily Cordarious Zeek, LCSWA [phone]) Licensed Clinical Social Worker Orthopedics [phone]([id number]) and Surgical [phone]([id number])

## 2015-01-08 NOTE — Clinical Social Work Psychosocial (Signed)
Clinical Social Work Department BRIEF PSYCHOSOCIAL ASSESSMENT 01/08/2015  Patient:  Johnathan [name],Johnathan [name]     Account Number:  [id number]     Admit date:  01/07/2015  Clinical Social Worker:  Mosie EpsteinVAUGHN,Lutie Pickler S, LCSWA  Date/Time:  01/08/2015 11:30 AM  Referred by:  Physician  Date Referred:  01/08/2015 Referred for  SNF Placement   Other Referral:   none.   Interview type:  Family Other interview type:   CSW spoke with patient's wife, Johnathan [name], regarding discharge disposition.    PSYCHOSOCIAL DATA Living Status:  FACILITY Admitted from facility:  [facility] Level of care:  Skilled Nursing Facility Primary support name:  Johnathan Freshwaterlizabeth Dowd Primary support relationship to patient:  SPOUSE Degree of support available:   Strong support system.    CURRENT CONCERNS Current Concerns  Post-Acute Placement   Other Concerns:   none.    SOCIAL WORK ASSESSMENT / PLAN CSW received referral patient admitted from [facility] and to return once medically stable for discharge. CSW confirmed with [facility] admissions liaison, patient is able to return once medically stable. CSW spoke with patient's wife, Johnathan [name], who also confirmed patient's discharge plan to be [facility]. CSW to continue to follow and assist with discharge planning needs.   Assessment/plan status:  Psychosocial Support/Ongoing Assessment of Needs Other assessment/ plan:   none.   Information/referral to community resources:   Patient to be discharged back to [facility] once medically stable for discharge.    PATIENT'S/FAMILY'S RESPONSE TO PLAN OF CARE: Patient's wife understanding and agreeable to CSW plan of care. Patient's wife expressed no further questions or concerns at this time.       Johnathan [name], LCSWA [phone]) Licensed Clinical Social Worker Orthopedics [phone]([id number]) and Surgical [phone]([id number])

## 2015-01-08 NOTE — Progress Notes (Signed)
PATIENT DETAILS Name: Johnathan [name] Age: 79 y.o. Sex: male Date of Birth: [birth year] Admit Date: 01/07/2015 Admitting Physician Rodolph Bonganiel Thompson V, MD QMV:[id number]:ELKINS,WILSON Joelene MillinLIVER, MD  Subjective: No major complaints.  Assessment/Plan: Active Problems:   Acute hypoxic respiratory failure: Secondary to healthcare associated pneumonia, taper off oxygen. Improved compared to on admission.    Healthcare associated pneumonia: Afebrile, without leukocytosis. Patient is nontoxic appearing. Blood cultures pending, urine legionella antigen negative, however streptococcal antigen positive. Continue with antibiotics and follow culture data.    Acute on chronic kidney disease stage III: Acute renal failure likely prerenal azotemia, returning back to baseline with hydration. Will stop IV fluid and encourage oral intake.    Dehydration: Resolved with IV fluids. Encourage oral intake, euvolemic on exam.    Type 2 diabetes: CBGs stable with SSI. Last A1c on 12/17/14 was 5.9, appears to be on multiple oral hypoglycemic agents as outpatient-suspect that we can minimize or just observe off oral hypoglycemics with A1c being at 5.9.     Anemia: Suspect anemia of chronic disease/acute illness. Slight drop in hemoglobin secondary to IV fluid dietitian and acute illness. Continue to monitor periodically, no overt evidence of bleeding at this time.    Recent Acetabular fracture and Right radial fracture:-Nonweight bearing right leg, continue supportive care. Will need to be discharged back to SNF in the next few days.    History of CVA: Nonfocal exam, continue Plavix    History of COPD: Lungs clear, continue Spiriva and nebs  Disposition: Remain inpatient  Antibiotics:  See below.   Anti-infectives    Start     Dose/Rate Route Frequency Ordered Stop   01/08/15 1800  vancomycin (VANCOCIN) IVPB 1000 mg/200 mL premix     1,000 mg200 mL/hr over 60 Minutes Intravenous Every 24 hours 01/07/15 1859     01/08/15 1730  vancomycin (VANCOCIN) IVPB 1000 mg/200 mL premix  Status:  Discontinued     1,000 mg200 mL/hr over 60 Minutes Intravenous Every 24 hours 01/07/15 1659 01/07/15 1835   01/08/15 0600  ceFEPIme (MAXIPIME) 2 g in dextrose 5 % 50 mL IVPB     2 g100 mL/hr over 30 Minutes Intravenous Every 24 hours 01/07/15 1852     01/07/15 2200  ceFEPIme (MAXIPIME) 1 g in dextrose 5 % 50 mL IVPB  Status:  Discontinued     1 g100 mL/hr over 30 Minutes Intravenous 3 times per day 01/07/15 1835 01/07/15 1852   01/07/15 1730  vancomycin (VANCOCIN) 1,250 mg in sodium chloride 0.9 % 250 mL IVPB     1,250 mg166.7 mL/hr over 90 Minutes Intravenous  Once 01/07/15 1659 01/07/15 1913   01/07/15 1645  ceFEPIme (MAXIPIME) 1 g in dextrose 5 % 50 mL IVPB     1 g100 mL/hr over 30 Minutes Intravenous  Once 01/07/15 1639 01/07/15 1720      DVT Prophylaxis: Prophylactic Heparin   Code Status: Full code   Family Communication Spouse at bedside  Procedures:  None  CONSULTS:  None  Time spent 40 minutes-which includes 50% of the time with face-to-face with patient/ family and coordinating care related to the above assessment and plan.  MEDICATIONS: Scheduled Meds: . ceFEPime (MAXIPIME) IV  2 g Intravenous Q24H  . cholecalciferol  2,000 Units Oral Daily  . clopidogrel  75 mg Oral Daily  . docusate sodium  100 mg Oral BID  . donepezil  10 mg Oral QHS  . feeding supplement (ENSURE COMPLETE)  237 mL Oral BID BM  . fentaNYL  12.5 mcg Transdermal Q72H  . gabapentin  300 mg Oral BID  . guaiFENesin  1,200 mg Oral BID  . heparin  5,000 Units Subcutaneous 3 times per day  . insulin aspart  0-5 Units Subcutaneous QHS  . insulin aspart  0-9 Units Subcutaneous TID WC  . ipratropium-albuterol  3 mL Nebulization Q6H  . iron polysaccharides  150 mg Oral Daily  . mometasone-formoterol  2 puff Inhalation BID  . multivitamin with minerals  1 tablet Oral Daily  . tiotropium  18 mcg Inhalation Daily  . vancomycin   1,000 mg Intravenous Q24H  . Vitamin D (Ergocalciferol)  50,000 Units Oral Q7 days   Continuous Infusions: . sodium chloride 0.9 % 1,000 mL infusion     PRN Meds:.acetaminophen, albuterol, ALPRAZolam, guaifenesin, methocarbamol, ondansetron (ZOFRAN) IV    PHYSICAL EXAM: Vital signs in last 24 hours: Filed Vitals:   01/08/15 0029 01/08/15 0659 01/08/15 0744 01/08/15 0829  BP:  116/38    Pulse: 66 74  75  Temp:  98.2 F (36.8 C)    TempSrc:  Oral    Resp: 22 22  22   Height:   5\' 10"  (1.778 m)   Weight:   72.16 kg (159 lb 1.3 oz)   SpO2:  94%  94%    Weight change:  Filed Weights   01/08/15 0744  Weight: 72.16 kg (159 lb 1.3 oz)   Body mass index is 22.83 kg/(m^2).   Gen Exam: Awake and alert with clear speech.   Neck: Supple, No JVD.   Chest: B/L Clear.   CVS: S1 S2 Regular, no murmurs.  Abdomen: soft, BS +, non tender, non distended.  Extremities: no edema, lower extremities warm to touch Neurologic: Non Focal.   Skin: No Rash.   Wounds: N/A.    Intake/Output from previous day:  Intake/Output Summary (Last 24 hours) at 01/08/15 1258 Last data filed at 01/08/15 1006  Gross per 24 hour  Intake    120 ml  Output    400 ml  Net   -280 ml     LAB RESULTS: CBC  Recent Labs Lab 01/07/15 1155 01/08/15 0545  WBC 13.1* 8.4  HGB 9.0* 8.4*  HCT 28.7* 26.7*  PLT 157 148*  MCV 99.3 100.8*  MCH 31.1 31.7  MCHC 31.4 31.5  RDW 14.3 14.5  LYMPHSABS 0.8  --   MONOABS 0.8  --   EOSABS 0.2  --   BASOSABS 0.0  --     Chemistries   Recent Labs Lab 01/07/15 1155 01/08/15 0545  NA 137 139  K 4.8 4.3  CL 100 106  CO2 31 29  GLUCOSE 75 66*  BUN 41* 26*  CREATININE 1.69* 1.31  CALCIUM 8.5 8.1*    CBG:  Recent Labs Lab 01/07/15 1850 01/07/15 1954 01/07/15 2313 01/08/15 0758 01/08/15 1226  GLUCAP 52* 116* 161* 72 122*    GFR Estimated Creatinine Clearance: 49 mL/min (by C-G formula based on Cr of 1.31).  Coagulation profile No results for  input(s): INR, PROTIME in the last 168 hours.  Cardiac Enzymes No results for input(s): CKMB, TROPONINI, MYOGLOBIN in the last 168 hours.  Invalid input(s): CK  Invalid input(s): POCBNP No results for input(s): DDIMER in the last 72 hours. No results for input(s): HGBA1C in the last 72 hours. No results for input(s): CHOL, HDL, LDLCALC, TRIG, CHOLHDL, LDLDIRECT in the last 72 hours. No results for input(s): TSH, T4TOTAL,  T3FREE, THYROIDAB in the last 72 hours.  Invalid input(s): FREET3 No results for input(s): VITAMINB12, FOLATE, FERRITIN, TIBC, IRON, RETICCTPCT in the last 72 hours. No results for input(s): LIPASE, AMYLASE in the last 72 hours.  Urine Studies No results for input(s): UHGB, CRYS in the last 72 hours.  Invalid input(s): UACOL, UAPR, USPG, UPH, UTP, UGL, UKET, UBIL, UNIT, UROB, ULEU, UEPI, UWBC, URBC, UBAC, CAST, UCOM, BILUA  MICROBIOLOGY: No results found for this or any previous visit (from the past 240 hour(s)).  RADIOLOGY STUDIES/RESULTS: Dg Chest 1 View  12/13/2014   CLINICAL DATA:  Right hip pain since a fall earlier today.  EXAM: CHEST - 1 VIEW  COMPARISON:  08/29/2007  FINDINGS: Heart size is normal. Pulmonary vascularity is at the upper limits of normal. Lungs are clear. There is no compression fracture in the mid thoracic spine.  IMPRESSION: No acute abnormalities of the chest.   Electronically Signed   By: Geanie Cooley M.D.   On: 12/13/2014 18:10   Dg Chest 2 View  01/07/2015   CLINICAL DATA:  Low grade fever. Elevated D-dimer. Weakness and confusion.  EXAM: CHEST  2 VIEW  COMPARISON:  12/17/2014 and 08/29/2007  FINDINGS: There is a dense consolidative infiltrate in the posterior medial aspect of the left lower lobe. There is a tiny right pleural effusion. Pulmonary vascularity is normal. Diffuse osteopenia with with multiple compression deformities in the thoracic spine, increased since the prior exam.  IMPRESSION: Left lower lobe pneumonia. Tiny right  effusion. Interval thoracic compression fractures since 2008.   Electronically Signed   By: Geanie Cooley M.D.   On: 01/07/2015 13:34   Dg Wrist Complete Right  12/13/2014   CLINICAL DATA:  Status post fall today with pain.  EXAM: RIGHT WRIST - COMPLETE 3+ VIEW  COMPARISON:  None.  FINDINGS: There is diffuse osteopenia. There is minimal displaced fracture of the distal radius. There is probable nondisplaced fracture of distal ulna. There is no dislocation.  IMPRESSION: Fracture of distal radius. Probable nondisplaced fracture of distal ulna.   Electronically Signed   By: Sherian Rein M.D.   On: 12/13/2014 18:09   Dg Hip Bilateral W/pelvis  12/13/2014   CLINICAL DATA:  Acute right hip pain after fall today.  EXAM: BILATERAL HIP WITH PELVIS - 4+ VIEW  COMPARISON:  None.  FINDINGS: Status post surgical fixation of old proximal right femoral fracture. Diffuse osteopenia is noted. Mildly displaced fracture of the medial acetabulum is noted. Mild degenerative change of the left hip is noted.  IMPRESSION: Mildly displaced fracture of the medial acetabulum. CT scan is recommended for further evaluation.   Electronically Signed   By: Roque Lias M.D.   On: 12/13/2014 18:14   Ct Head Wo Contrast  12/13/2014   CLINICAL DATA:  Fall today.  EXAM: CT HEAD WITHOUT CONTRAST  CT CERVICAL SPINE WITHOUT CONTRAST  TECHNIQUE: Multidetector CT imaging of the head and cervical spine was performed following the standard protocol without intravenous contrast. Multiplanar CT image reconstructions of the cervical spine were also generated.  COMPARISON:  MRI brain 08/20/2007  FINDINGS: CT HEAD FINDINGS  A remote left PCA territory infarct it is noted. Other areas of remote encephalomalacia include in the anterior left frontal lobe than the posterior right frontal lobe and parietal lobe are otherwise stable. Diffuse white matter changes are evident. Remote lacunar infarcts are noted in the basal ganglia bilaterally. Extensive remote  lacunar infarcts are present within both cerebellar hemispheres.  No acute  cortical infarct, hemorrhage, or mass lesion is present. The ventricles are proportionate to the degree of atrophy with ex vacuo dilation of the left lateral ventricle in particular. No significant extraaxial fluid collection is present.  The paranasal sinuses and mastoid air cells are clear. The calvarium is intact. Atherosclerotic calcifications are present within the cavernous internal carotid arteries bilaterally.  CT CERVICAL SPINE FINDINGS  Leftward curvature of the cervical spine is noted. Vertebral body heights AP alignment are maintained. No acute fracture or traumatic subluxation is evident. Mild degenerative changes are noted at the C1-2 arch. The soft tissues of the neck demonstrate atherosclerotic calcifications bilaterally, worse on the right. No other focal lesions are evident. Severe emphysematous changes are present.  IMPRESSION: 1. Multiple remote infarcts as described. 2. Remote lacunar infarcts within the basal ganglia and cerebellum. 3. No acute intracranial abnormality or evidence for acute trauma. 4. Levoconvex curvature of the cervical spine without evidence for acute trauma. 5. Severe emphysematous changes.   Electronically Signed   By: Gennette Pac M.D.   On: 12/13/2014 20:41   Ct Angio Chest Pe W/cm &/or Wo Cm  01/07/2015   CLINICAL DATA:  Fever, elevated D-dimer.  EXAM: CT ANGIOGRAPHY CHEST WITH CONTRAST  TECHNIQUE: Multidetector CT imaging of the chest was performed using the standard protocol during bolus administration of intravenous contrast. Multiplanar CT image reconstructions and MIPs were obtained to evaluate the vascular anatomy.  CONTRAST:  80mL OMNIPAQUE IOHEXOL 350 MG/ML SOLN  COMPARISON:  Chest radiograph of same day.  FINDINGS: Severe wedge compression deformity of mid thoracic vertebral body is noted which most likely is old. No pneumothorax is noted. Large left lower lobe airspace opacity is  noted concerning for pneumonia with mild associated pleural effusion. 6 mm nodule is noted anteriorly in right lower lobe along major fissure best seen on image number 47 of series 6. There is no evidence of pulmonary embolus. There is no evidence of thoracic aortic dissection or aneurysm. Great vessels are widely patent. No mediastinal mass or adenopathy is noted. No significant abnormality seen in the visualized portion of the upper abdomen. No significant osseous abnormality is noted.  Review of the MIP images confirms the above findings.  IMPRESSION: No evidence of pulmonary embolus.  Large left lower lobe airspace opacity is noted concerning for pneumonia with mild associated pleural effusion.  Probable old severe compression fracture seen involving mid thoracic spine.   Electronically Signed   By: Roque Lias M.D.   On: 01/07/2015 16:30   Ct Cervical Spine Wo Contrast  12/13/2014   CLINICAL DATA:  Fall today.  EXAM: CT HEAD WITHOUT CONTRAST  CT CERVICAL SPINE WITHOUT CONTRAST  TECHNIQUE: Multidetector CT imaging of the head and cervical spine was performed following the standard protocol without intravenous contrast. Multiplanar CT image reconstructions of the cervical spine were also generated.  COMPARISON:  MRI brain 08/20/2007  FINDINGS: CT HEAD FINDINGS  A remote left PCA territory infarct it is noted. Other areas of remote encephalomalacia include in the anterior left frontal lobe than the posterior right frontal lobe and parietal lobe are otherwise stable. Diffuse white matter changes are evident. Remote lacunar infarcts are noted in the basal ganglia bilaterally. Extensive remote lacunar infarcts are present within both cerebellar hemispheres.  No acute cortical infarct, hemorrhage, or mass lesion is present. The ventricles are proportionate to the degree of atrophy with ex vacuo dilation of the left lateral ventricle in particular. No significant extraaxial fluid collection is present.  The  paranasal sinuses and mastoid air cells are clear. The calvarium is intact. Atherosclerotic calcifications are present within the cavernous internal carotid arteries bilaterally.  CT CERVICAL SPINE FINDINGS  Leftward curvature of the cervical spine is noted. Vertebral body heights AP alignment are maintained. No acute fracture or traumatic subluxation is evident. Mild degenerative changes are noted at the C1-2 arch. The soft tissues of the neck demonstrate atherosclerotic calcifications bilaterally, worse on the right. No other focal lesions are evident. Severe emphysematous changes are present.  IMPRESSION: 1. Multiple remote infarcts as described. 2. Remote lacunar infarcts within the basal ganglia and cerebellum. 3. No acute intracranial abnormality or evidence for acute trauma. 4. Levoconvex curvature of the cervical spine without evidence for acute trauma. 5. Severe emphysematous changes.   Electronically Signed   By: Gennette Pac M.D.   On: 12/13/2014 20:41   Ct Pelvis Wo Contrast  12/13/2014   CLINICAL DATA:  Status post fall today; persistent moderate right hip pain status post fall. CT recommended for further evaluation of right-sided pelvic fracture. Initial encounter.  EXAM: CT PELVIS WITHOUT CONTRAST  TECHNIQUE: Multidetector CT imaging of the pelvis was performed following the standard protocol without intravenous contrast.  COMPARISON:  Pelvis and bilateral hip radiographs performed earlier today at 5:31 p.m.  FINDINGS: There appears to be a T-shaped fracture through the right acetabulum; the transverse component is thought to be juxtatectal in nature, with perhaps 5 mm of separation. The anterior component demonstrates only minimal displacement, measuring perhaps 3 mm, and extends into the anterior wall more inferiorly, with partial disruption at the lateral aspect of the right superior pubic ramus. There is a minimally displaced fracture of the right inferior pubic ramus.  There also appears to  be an avulsion fracture involving the right anterior inferior iliac spine, with mild displacement. No additional fractures are seen.  The sacroiliac joints are grossly unremarkable. The pubic symphysis is grossly unremarkable in appearance. The patient's right femoral hardware is grossly unremarkable in appearance, though difficult to fully characterize. No significant joint space narrowing is seen. There is significant chronic compression deformity of vertebral body L4.  Visualized small and large bowel loops are grossly unremarkable. Diffuse calcification is seen along the distal abdominal aorta and its branches. Mild soft tissue hematoma is noted along the right pelvic sidewall, tracking minimally into the right lower quadrant in a retroperitoneal distribution, with mild leftward displacement of the bladder.  IMPRESSION: 1. T-shaped fracture of the right acetabulum; the transverse component is thought to be juxtatectal in nature. The anterior component extends into the anterior wall more inferiorly, with partial disruption at the lateral aspect of the right superior pubic ramus. Minimally displaced fracture of the right inferior pubic ramus. Displacement measures perhaps 5 mm at the transverse component and 3 mm at the anterior component. 2. Avulsion fracture involving the right anterior inferior iliac spine, with mild displacement. 3. Associated mild soft tissue hematoma along the right pelvic sidewall, tracking minimally into the right lower quadrant in a retroperitoneal distribution. 4. Diffuse calcification along the distal abdominal aorta and its branches. 5. Significant chronic compression deformity of vertebral body L4.   Electronically Signed   By: Roanna Raider M.D.   On: 12/13/2014 21:29   Dg Pelvis Comp Min 3v  12/16/2014   CLINICAL DATA:  Fall a few months ago with persistent right hip pain.  EXAM: JUDET PELVIS - 3+ VIEW  COMPARISON:  12/13/2014 and CT 12/13/2014  FINDINGS: There is diffuse  decreased bone mineralization.  Fixation hardware intact over the right proximal femur. Moderate bilateral osteoarthritic changes of the hips. Continued evidence of patient's known right acetabular fracture. Evidence of patient's known right inferior pubic ramus fracture. Degenerate changes of the spine. Calcified plaque over the abdominal aorta and iliac arteries. Remainder of the exam is unchanged.  IMPRESSION: No significant change in patient's known right acetabular fracture and right inferior pubis ramus fracture.  Moderate diffuse decreased bone mineralization with moderate bilateral osteoarthritis of the hips.   Electronically Signed   By: Elberta Fortis M.D.   On: 12/16/2014 11:34   Dg Chest Port 1 View  12/17/2014   CLINICAL DATA:  Shortness of breath, pneumonia  EXAM: PORTABLE CHEST - 1 VIEW  COMPARISON:  12/14/2014  FINDINGS: Cardiac shadow is stable. The lungs are again well aerated. Given some limitation secondary to patient rotation no focal infiltrate or sizable effusion is seen. No acute bony abnormality is noted.  IMPRESSION: No active disease.   Electronically Signed   By: Alcide Clever M.D.   On: 12/17/2014 10:39   Dg Chest Port 1 View  12/14/2014   CLINICAL DATA:  Shortness of breath. Patient fell in broke right pelvis.  EXAM: PORTABLE CHEST - 1 VIEW  COMPARISON:  12/13/2014  FINDINGS: Shallow inspiration. Heart size and pulmonary vascularity are normal for technique. Suggestion of mild atelectasis in the left lung base. No blunting of costophrenic angles. No pneumothorax. Calcified and tortuous aorta. Degenerative changes in the spine and shoulders.  IMPRESSION: Suggestion of slight atelectasis in the left lung base.   Electronically Signed   By: Burman Nieves M.D.   On: 12/14/2014 04:38   Dg Knee Complete 4 Views Right  12/15/2014   CLINICAL DATA:  Right knee pain since a fall several days ago. Acute right acetabular fracture  EXAM: RIGHT KNEE - COMPLETE 4+ VIEW  COMPARISON:  Report of  radiographs dated 01/12/2004  FINDINGS: There is diffuse osteopenia. No joint space narrowing. No fracture or dislocation or joint effusion.  IMPRESSION: No acute abnormality.   Electronically Signed   By: Geanie Cooley M.D.   On: 12/15/2014 16:20   Dg Abd 2 Views  12/15/2014   CLINICAL DATA:  Left lower quadrant abdominal pain. Acute right hip fracture.  EXAM: ABDOMEN - 2 VIEW  COMPARISON:  CT scan of the pelvis dated 12/13/2014 and radiograph dated 12/13/1998 extend  FINDINGS: Bowel gas pattern is normal. No free air or free fluid. Old compression fracture of L4. Extensive vascular calcification in the abdomen and pelvis. Right acetabular fracture is is quite subtle on the AP view.  IMPRESSION: Benign appearing abdomen except for right acetabular fracture and old L4 fracture.   Electronically Signed   By: Geanie Cooley M.D.   On: 12/15/2014 16:24    Jeoffrey Massed, MD  [facility] Pager:[phone]  If 7PM-7AM, please contact night-coverage [email or web address] Password [email or web address] 01/08/2015, 12:58 PM   LOS: 1 day

## 2015-01-09 LAB — URINE CULTURE
Colony Count: NO GROWTH
Culture: NO GROWTH

## 2015-01-09 LAB — GLUCOSE, CAPILLARY
GLUCOSE-CAPILLARY: 96 mg/dL (ref 70–99)
GLUCOSE-CAPILLARY: 148 mg/dL — AB (ref 70–99)

## 2015-01-09 LAB — HIV ANTIBODY (ROUTINE TESTING W REFLEX): HIV SCREEN 4TH GENERATION: NONREACTIVE

## 2015-01-09 MED ORDER — ENSURE COMPLETE PO LIQD: 237.0000 mL | Freq: Two times a day (BID) | ORAL | Status: DC

## 2015-01-09 MED ORDER — CEFPODOXIME PROXETIL 200 MG PO TABS
200.0000 mg | ORAL_TABLET | Freq: Two times a day (BID) | ORAL | Status: DC
  Administered 2015-01-09: 200 mg via ORAL
  Filled 2015-01-09 (×2): qty 1

## 2015-01-09 MED ORDER — ALPRAZOLAM 0.25 MG PO TABS: 0.2500 mg | ORAL_TABLET | Freq: Three times a day (TID) | ORAL | Status: DC | PRN

## 2015-01-09 MED ORDER — CEFPODOXIME PROXETIL 200 MG PO TABS: 200.0000 mg | ORAL_TABLET | Freq: Two times a day (BID) | ORAL | Status: DC

## 2015-01-09 MED ORDER — PIOGLITAZONE HCL 30 MG PO TABS: 30.0000 mg | ORAL_TABLET | Freq: Every day | ORAL | Status: DC

## 2015-01-09 MED ORDER — GUAIFENESIN ER 600 MG PO TB12: 1200.0000 mg | ORAL_TABLET | Freq: Two times a day (BID) | ORAL | Status: DC

## 2015-01-09 NOTE — Plan of Care (Signed)
Problem: Phase III Progression Outcomes Goal: O2 sats > or equal to 93% on room air Outcome: Adequate for Discharge Patient uses O2 at baseline.

## 2015-01-09 NOTE — Progress Notes (Signed)
Johnathan [name] to be D/C'd Skilled nursing facility: [facility] per MD order.  Discussed with the patient and all questions fully answered.  VSS, Skin clean, dry and intact without evidence of skin break down, no evidence of skin tears noted. IV catheter discontinued intact. Site without signs and symptoms of complications. Dressing and pressure applied.  Social work packet includes discharge summary and prescriptions.  Packet sent with EMS.  Patient instructed to return to ED, call 911, or call MD for any changes in condition.   Patient escorted via stretcher and transported to [facility] via EMS.  [facility] Beechamynthia[facility]  Burt EkCook, Jed Kutch D 01/09/2015 12:34 PM

## 2015-01-09 NOTE — Discharge Summary (Signed)
PATIENT DETAILS Name: Johnathan [name] Age: 79 y.o. Sex: male Date of Birth: [birth year] MRN: [id number]. Admitting Physician: Rodolph Bong, MD WUJ:[id number],[id number] [name], MD  Admit Date: 01/07/2015 Discharge date: 01/09/2015  Recommendations for Outpatient Follow-up:  1. Please repeat chest x-ray in 6-8 weeks to document resolution of the infiltrate. 2. A1c at 5.9, I have discontinued multiple oral hypoglycemic agents, and have just place the patient on Actos. Please monitor and recheck A1c in 3 months 3. Please taper off oxygen as tolerated.  PRIMARY DISCHARGE DIAGNOSIS:  Active Problems:   Recent Acetabular fracture   Right radial fracture   Diabetes mellitus   History of CVA (cerebrovascular accident)   Dementia   HCAP (healthcare-associated pneumonia)   ARF (acute renal failure)   Anemia   Acute respiratory failure with hypoxia   Dehydration   CKD (chronic kidney disease), stage III      PAST MEDICAL HISTORY: Past Medical History  Diagnosis Date  . Diabetes mellitus without complication   . Stroke   . COPD (chronic obstructive pulmonary disease)   . Falls   . Vitamin D deficiency   . Acquired deformity of left upper arm   . Nicotine dependence   . Closed fracture of neck of right femur with routine healing   . Fracture of right acetabulum with routine healing   . Closed fracture of right forearm with routine healing     DISCHARGE MEDICATIONS: Current Discharge Medication List    START taking these medications   Details  cefpodoxime (VANTIN) 200 MG tablet Take 1 tablet (200 mg total) by mouth every 12 (twelve) hours. Qty: 14 tablet, Refills: 0    guaiFENesin (MUCINEX) 600 MG 12 hr tablet Take 2 tablets (1,200 mg total) by mouth 2 (two) times daily.      CONTINUE these medications which have CHANGED   Details  ALPRAZolam (XANAX) 0.25 MG tablet Take 1 tablet (0.25 mg total) by mouth 3 (three) times daily as needed for anxiety. Qty: 30 tablet, Refills: 0      feeding supplement, ENSURE COMPLETE, (ENSURE COMPLETE) LIQD Take 237 mLs by mouth 2 (two) times daily between meals.    pioglitazone (ACTOS) 30 MG tablet Take 1 tablet (30 mg total) by mouth at bedtime.      CONTINUE these medications which have NOT CHANGED   Details  acetaminophen (TYLENOL) 500 MG tablet Take 1-2 tablets (500-1,000 mg total) by mouth every 6 (six) hours as needed for mild pain, moderate pain, fever or headache. Qty: 30 tablet, Refills: 0    albuterol (PROVENTIL) (2.5 MG/3ML) 0.083% nebulizer solution Take 3 mLs (2.5 mg total) by nebulization every 4 (four) hours as needed for wheezing or shortness of breath. Qty: 75 mL, Refills: 12    Amino Acids-Protein Hydrolys (FEEDING SUPPLEMENT, PRO-STAT SUGAR FREE 64,) LIQD Take 30 mLs by mouth 2 (two) times daily.    clopidogrel (PLAVIX) 75 MG tablet Take 75 mg by mouth daily.    docusate sodium (COLACE) 100 MG capsule Take 1 capsule (100 mg total) by mouth 2 (two) times daily. Qty: 10 capsule, Refills: 0    donepezil (ARICEPT) 10 MG tablet Take 10 mg by mouth at bedtime.    fentaNYL (DURAGESIC - DOSED MCG/HR) 12 MCG/HR Place 1 patch (12.5 mcg total) onto the skin every 3 (three) days. Qty: 5 patch, Refills: 0    gabapentin (NEURONTIN) 300 MG capsule Take 300 mg by mouth 2 (two) times daily.     iron polysaccharides (NIFEREX)  150 MG capsule Take 1 capsule (150 mg total) by mouth daily.    magnesium hydroxide (MILK OF MAGNESIA) 400 MG/5ML suspension Take 30 mLs by mouth daily as needed for mild constipation (if no BM in 3 days).    methocarbamol (ROBAXIN) 500 MG tablet Take 1 tablet (500 mg total) by mouth every 6 (six) hours as needed for muscle spasms. Qty: 30 tablet    mometasone-formoterol (DULERA) 100-5 MCG/ACT AERO Inhale 2 puffs into the lungs 2 (two) times daily.    Multiple Vitamin (MULTIVITAMIN WITH MINERALS) TABS tablet Take 1 tablet by mouth daily.    nicotine (NICODERM CQ - DOSED IN MG/24 HOURS) 21  mg/24hr patch Place 21 mg onto the skin daily. For 6 weeks, discontinue med on 01/31/15    OVER THE COUNTER MEDICATION Take 120 mLs by mouth 2 (two) times daily. "Med pass" supplement    tiotropium (SPIRIVA) 18 MCG inhalation capsule Place 1 capsule (18 mcg total) into inhaler and inhale daily. Qty: 30 capsule, Refills: 12    triamcinolone cream (KENALOG) 0.5 % Apply 1 application topically as needed (for rash).    Vitamin D, Ergocalciferol, (DRISDOL) 50000 UNITS CAPS capsule Take 1 capsule (50,000 Units total) by mouth every 7 (seven) days. Qty: 30 capsule    buPROPion (WELLBUTRIN XL) 150 MG 24 hr tablet Take 150 mg by mouth daily.      STOP taking these medications     ciprofloxacin (CIPRO) 500 MG tablet      glimepiride (AMARYL) 4 MG tablet      sitaGLIPtin (JANUVIA) 100 MG tablet      cholecalciferol (VITAMIN D) 1000 UNITS tablet         ALLERGIES:   Allergies  Allergen Reactions  . Hydrocodone Other (See Comments)    Ineffective for pain control, hallucinations, lethargic/very sleepy  . Solarcaine [Benzocaine] Other (See Comments)    Severe burning  . Latex Itching and Rash    BRIEF HPI:  See H&P, Labs, Consult and Test reports for all details in brief, patient 79 year old male patient with past medical history of dementia, diabetes, prior CVA, COPD on chest x-ray and tobacco abuse. Recently discharged from this facility on 1/7 after an admission for an acetabular fracture with right radial fracture after fall at home. He was discharged to skilled nursing facility for ongoing physical therapy and occupational therapy. Patient was sent to the ER from the nursing facility after reports of fever of  with cough. He was found for pneumonia and admitted to the hospitalist service for further evaluation and treatment  CONSULTATIONS:   None  PERTINENT RADIOLOGIC STUDIES: Dg Chest 1 View  12/13/2014   CLINICAL DATA:  Right hip pain since a fall earlier today.  EXAM: CHEST - 1  VIEW  COMPARISON:  08/29/2007  FINDINGS: Heart size is normal. Pulmonary vascularity is at the upper limits of normal. Lungs are clear. There is no compression fracture in the mid thoracic spine.  IMPRESSION: No acute abnormalities of the chest.   Electronically Signed   By: Geanie Cooley M.D.   On: 12/13/2014 18:10   Dg Chest 2 View  01/08/2015   CLINICAL DATA:  Shortness of breath, weakness, health care associated pneumonia, diabetes mellitus, stroke, COPD, smoker  EXAM: CHEST  2 VIEW  COMPARISON:  01/07/2015  FINDINGS: Rotated to the LEFT.  Upper normal heart size.  Atherosclerotic calcification aorta.  Mediastinal contours and pulmonary vascularity normal.  Persistent LEFT lower lobe consolidation consistent with pneumonia.  Tiny  bibasilar effusions.  Remaining lungs clear.  No pneumothorax.  Bones severely demineralized  IMPRESSION: Persistent LEFT lower lobe pneumonia.  Tiny pleural effusions.   Electronically Signed   By: Ulyses Southward M.D.   On: 01/08/2015 08:10   Dg Chest 2 View  01/07/2015   CLINICAL DATA:  Low grade fever. Elevated D-dimer. Weakness and confusion.  EXAM: CHEST  2 VIEW  COMPARISON:  12/17/2014 and 08/29/2007  FINDINGS: There is a dense consolidative infiltrate in the posterior medial aspect of the left lower lobe. There is a tiny right pleural effusion. Pulmonary vascularity is normal. Diffuse osteopenia with with multiple compression deformities in the thoracic spine, increased since the prior exam.  IMPRESSION: Left lower lobe pneumonia. Tiny right effusion. Interval thoracic compression fractures since 2008.   Electronically Signed   By: Geanie Cooley M.D.   On: 01/07/2015 13:34   Dg Wrist Complete Right  12/13/2014   CLINICAL DATA:  Status post fall today with pain.  EXAM: RIGHT WRIST - COMPLETE 3+ VIEW  COMPARISON:  None.  FINDINGS: There is diffuse osteopenia. There is minimal displaced fracture of the distal radius. There is probable nondisplaced fracture of distal ulna. There  is no dislocation.  IMPRESSION: Fracture of distal radius. Probable nondisplaced fracture of distal ulna.   Electronically Signed   By: Sherian Rein M.D.   On: 12/13/2014 18:09   Dg Hip Bilateral W/pelvis  12/13/2014   CLINICAL DATA:  Acute right hip pain after fall today.  EXAM: BILATERAL HIP WITH PELVIS - 4+ VIEW  COMPARISON:  None.  FINDINGS: Status post surgical fixation of old proximal right femoral fracture. Diffuse osteopenia is noted. Mildly displaced fracture of the medial acetabulum is noted. Mild degenerative change of the left hip is noted.  IMPRESSION: Mildly displaced fracture of the medial acetabulum. CT scan is recommended for further evaluation.   Electronically Signed   By: Roque Lias M.D.   On: 12/13/2014 18:14   Ct Head Wo Contrast  12/13/2014   CLINICAL DATA:  Fall today.  EXAM: CT HEAD WITHOUT CONTRAST  CT CERVICAL SPINE WITHOUT CONTRAST  TECHNIQUE: Multidetector CT imaging of the head and cervical spine was performed following the standard protocol without intravenous contrast. Multiplanar CT image reconstructions of the cervical spine were also generated.  COMPARISON:  MRI brain 08/20/2007  FINDINGS: CT HEAD FINDINGS  A remote left PCA territory infarct it is noted. Other areas of remote encephalomalacia include in the anterior left frontal lobe than the posterior right frontal lobe and parietal lobe are otherwise stable. Diffuse white matter changes are evident. Remote lacunar infarcts are noted in the basal ganglia bilaterally. Extensive remote lacunar infarcts are present within both cerebellar hemispheres.  No acute cortical infarct, hemorrhage, or mass lesion is present. The ventricles are proportionate to the degree of atrophy with ex vacuo dilation of the left lateral ventricle in particular. No significant extraaxial fluid collection is present.  The paranasal sinuses and mastoid air cells are clear. The calvarium is intact. Atherosclerotic calcifications are present within the  cavernous internal carotid arteries bilaterally.  CT CERVICAL SPINE FINDINGS  Leftward curvature of the cervical spine is noted. Vertebral body heights AP alignment are maintained. No acute fracture or traumatic subluxation is evident. Mild degenerative changes are noted at the C1-2 arch. The soft tissues of the neck demonstrate atherosclerotic calcifications bilaterally, worse on the right. No other focal lesions are evident. Severe emphysematous changes are present.  IMPRESSION: 1. Multiple remote infarcts as described. 2.  Remote lacunar infarcts within the basal ganglia and cerebellum. 3. No acute intracranial abnormality or evidence for acute trauma. 4. Levoconvex curvature of the cervical spine without evidence for acute trauma. 5. Severe emphysematous changes.   Electronically Signed   By: Gennette Pac M.D.   On: 12/13/2014 20:41   Ct Angio Chest Pe W/cm &/or Wo Cm  01/07/2015   CLINICAL DATA:  Fever, elevated D-dimer.  EXAM: CT ANGIOGRAPHY CHEST WITH CONTRAST  TECHNIQUE: Multidetector CT imaging of the chest was performed using the standard protocol during bolus administration of intravenous contrast. Multiplanar CT image reconstructions and MIPs were obtained to evaluate the vascular anatomy.  CONTRAST:  80mL OMNIPAQUE IOHEXOL 350 MG/ML SOLN  COMPARISON:  Chest radiograph of same day.  FINDINGS: Severe wedge compression deformity of mid thoracic vertebral body is noted which most likely is old. No pneumothorax is noted. Large left lower lobe airspace opacity is noted concerning for pneumonia with mild associated pleural effusion. 6 mm nodule is noted anteriorly in right lower lobe along major fissure best seen on image number 47 of series 6. There is no evidence of pulmonary embolus. There is no evidence of thoracic aortic dissection or aneurysm. Great vessels are widely patent. No mediastinal mass or adenopathy is noted. No significant abnormality seen in the visualized portion of the upper abdomen. No  significant osseous abnormality is noted.  Review of the MIP images confirms the above findings.  IMPRESSION: No evidence of pulmonary embolus.  Large left lower lobe airspace opacity is noted concerning for pneumonia with mild associated pleural effusion.  Probable old severe compression fracture seen involving mid thoracic spine.   Electronically Signed   By: Roque Lias M.D.   On: 01/07/2015 16:30   Ct Cervical Spine Wo Contrast  12/13/2014   CLINICAL DATA:  Fall today.  EXAM: CT HEAD WITHOUT CONTRAST  CT CERVICAL SPINE WITHOUT CONTRAST  TECHNIQUE: Multidetector CT imaging of the head and cervical spine was performed following the standard protocol without intravenous contrast. Multiplanar CT image reconstructions of the cervical spine were also generated.  COMPARISON:  MRI brain 08/20/2007  FINDINGS: CT HEAD FINDINGS  A remote left PCA territory infarct it is noted. Other areas of remote encephalomalacia include in the anterior left frontal lobe than the posterior right frontal lobe and parietal lobe are otherwise stable. Diffuse white matter changes are evident. Remote lacunar infarcts are noted in the basal ganglia bilaterally. Extensive remote lacunar infarcts are present within both cerebellar hemispheres.  No acute cortical infarct, hemorrhage, or mass lesion is present. The ventricles are proportionate to the degree of atrophy with ex vacuo dilation of the left lateral ventricle in particular. No significant extraaxial fluid collection is present.  The paranasal sinuses and mastoid air cells are clear. The calvarium is intact. Atherosclerotic calcifications are present within the cavernous internal carotid arteries bilaterally.  CT CERVICAL SPINE FINDINGS  Leftward curvature of the cervical spine is noted. Vertebral body heights AP alignment are maintained. No acute fracture or traumatic subluxation is evident. Mild degenerative changes are noted at the C1-2 arch. The soft tissues of the neck  demonstrate atherosclerotic calcifications bilaterally, worse on the right. No other focal lesions are evident. Severe emphysematous changes are present.  IMPRESSION: 1. Multiple remote infarcts as described. 2. Remote lacunar infarcts within the basal ganglia and cerebellum. 3. No acute intracranial abnormality or evidence for acute trauma. 4. Levoconvex curvature of the cervical spine without evidence for acute trauma. 5. Severe emphysematous changes.  Electronically Signed   By: Gennette Pachris  Mattern M.D.   On: 12/13/2014 20:41   Ct Pelvis Wo Contrast  12/13/2014   CLINICAL DATA:  Status post fall today; persistent moderate right hip pain status post fall. CT recommended for further evaluation of right-sided pelvic fracture. Initial encounter.  EXAM: CT PELVIS WITHOUT CONTRAST  TECHNIQUE: Multidetector CT imaging of the pelvis was performed following the standard protocol without intravenous contrast.  COMPARISON:  Pelvis and bilateral hip radiographs performed earlier today at 5:31 p.m.  FINDINGS: There appears to be a T-shaped fracture through the right acetabulum; the transverse component is thought to be juxtatectal in nature, with perhaps 5 mm of separation. The anterior component demonstrates only minimal displacement, measuring perhaps 3 mm, and extends into the anterior wall more inferiorly, with partial disruption at the lateral aspect of the right superior pubic ramus. There is a minimally displaced fracture of the right inferior pubic ramus.  There also appears to be an avulsion fracture involving the right anterior inferior iliac spine, with mild displacement. No additional fractures are seen.  The sacroiliac joints are grossly unremarkable. The pubic symphysis is grossly unremarkable in appearance. The patient's right femoral hardware is grossly unremarkable in appearance, though difficult to fully characterize. No significant joint space narrowing is seen. There is significant chronic compression  deformity of vertebral body L4.  Visualized small and large bowel loops are grossly unremarkable. Diffuse calcification is seen along the distal abdominal aorta and its branches. Mild soft tissue hematoma is noted along the right pelvic sidewall, tracking minimally into the right lower quadrant in a retroperitoneal distribution, with mild leftward displacement of the bladder.  IMPRESSION: 1. T-shaped fracture of the right acetabulum; the transverse component is thought to be juxtatectal in nature. The anterior component extends into the anterior wall more inferiorly, with partial disruption at the lateral aspect of the right superior pubic ramus. Minimally displaced fracture of the right inferior pubic ramus. Displacement measures perhaps 5 mm at the transverse component and 3 mm at the anterior component. 2. Avulsion fracture involving the right anterior inferior iliac spine, with mild displacement. 3. Associated mild soft tissue hematoma along the right pelvic sidewall, tracking minimally into the right lower quadrant in a retroperitoneal distribution. 4. Diffuse calcification along the distal abdominal aorta and its branches. 5. Significant chronic compression deformity of vertebral body L4.   Electronically Signed   By: Roanna RaiderJeffery  Chang M.D.   On: 12/13/2014 21:29   Dg Pelvis Comp Min 3v  12/16/2014   CLINICAL DATA:  Fall a few months ago with persistent right hip pain.  EXAM: JUDET PELVIS - 3+ VIEW  COMPARISON:  12/13/2014 and CT 12/13/2014  FINDINGS: There is diffuse decreased bone mineralization. Fixation hardware intact over the right proximal femur. Moderate bilateral osteoarthritic changes of the hips. Continued evidence of patient's known right acetabular fracture. Evidence of patient's known right inferior pubic ramus fracture. Degenerate changes of the spine. Calcified plaque over the abdominal aorta and iliac arteries. Remainder of the exam is unchanged.  IMPRESSION: No significant change in patient's  known right acetabular fracture and right inferior pubis ramus fracture.  Moderate diffuse decreased bone mineralization with moderate bilateral osteoarthritis of the hips.   Electronically Signed   By: Elberta Fortisaniel  Boyle M.D.   On: 12/16/2014 11:34   Dg Chest Port 1 View  12/17/2014   CLINICAL DATA:  Shortness of breath, pneumonia  EXAM: PORTABLE CHEST - 1 VIEW  COMPARISON:  12/14/2014  FINDINGS: Cardiac shadow is  stable. The lungs are again well aerated. Given some limitation secondary to patient rotation no focal infiltrate or sizable effusion is seen. No acute bony abnormality is noted.  IMPRESSION: No active disease.   Electronically Signed   By: Alcide Clever M.D.   On: 12/17/2014 10:39   Dg Chest Port 1 View  12/14/2014   CLINICAL DATA:  Shortness of breath. Patient fell in broke right pelvis.  EXAM: PORTABLE CHEST - 1 VIEW  COMPARISON:  12/13/2014  FINDINGS: Shallow inspiration. Heart size and pulmonary vascularity are normal for technique. Suggestion of mild atelectasis in the left lung base. No blunting of costophrenic angles. No pneumothorax. Calcified and tortuous aorta. Degenerative changes in the spine and shoulders.  IMPRESSION: Suggestion of slight atelectasis in the left lung base.   Electronically Signed   By: Burman Nieves M.D.   On: 12/14/2014 04:38   Dg Knee Complete 4 Views Right  12/15/2014   CLINICAL DATA:  Right knee pain since a fall several days ago. Acute right acetabular fracture  EXAM: RIGHT KNEE - COMPLETE 4+ VIEW  COMPARISON:  Report of radiographs dated 01/12/2004  FINDINGS: There is diffuse osteopenia. No joint space narrowing. No fracture or dislocation or joint effusion.  IMPRESSION: No acute abnormality.   Electronically Signed   By: Geanie Cooley M.D.   On: 12/15/2014 16:20   Dg Abd 2 Views  12/15/2014   CLINICAL DATA:  Left lower quadrant abdominal pain. Acute right hip fracture.  EXAM: ABDOMEN - 2 VIEW  COMPARISON:  CT scan of the pelvis dated 12/13/2014 and radiograph  dated 12/13/1998 extend  FINDINGS: Bowel gas pattern is normal. No free air or free fluid. Old compression fracture of L4. Extensive vascular calcification in the abdomen and pelvis. Right acetabular fracture is is quite subtle on the AP view.  IMPRESSION: Benign appearing abdomen except for right acetabular fracture and old L4 fracture.   Electronically Signed   By: Geanie Cooley M.D.   On: 12/15/2014 16:24     PERTINENT LAB RESULTS: CBC:  Recent Labs  01/07/15 1155 01/08/15 0545  WBC 13.1* 8.4  HGB 9.0* 8.4*  HCT 28.7* 26.7*  PLT 157 148*   CMET CMP     Component Value Date/Time   NA 139 01/08/2015 0545   K 4.3 01/08/2015 0545   CL 106 01/08/2015 0545   CO2 29 01/08/2015 0545   GLUCOSE 66* 01/08/2015 0545   BUN 26* 01/08/2015 0545   CREATININE 1.31 01/08/2015 0545   CALCIUM 8.1* 01/08/2015 0545   CALCIUM 7.9* 12/17/2014 0643   PROT 5.8* 01/07/2015 1155   ALBUMIN 2.4* 01/07/2015 1155   AST 17 01/07/2015 1155   ALT 8 01/07/2015 1155   ALKPHOS 105 01/07/2015 1155   BILITOT 0.6 01/07/2015 1155   GFRNONAA 51* 01/08/2015 0545   GFRAA 59* 01/08/2015 0545    GFR Estimated Creatinine Clearance: 49 mL/min (by C-G formula based on Cr of 1.31). No results for input(s): LIPASE, AMYLASE in the last 72 hours. No results for input(s): CKTOTAL, CKMB, CKMBINDEX, TROPONINI in the last 72 hours. Invalid input(s): POCBNP No results for input(s): DDIMER in the last 72 hours. No results for input(s): HGBA1C in the last 72 hours. No results for input(s): CHOL, HDL, LDLCALC, TRIG, CHOLHDL, LDLDIRECT in the last 72 hours. No results for input(s): TSH, T4TOTAL, T3FREE, THYROIDAB in the last 72 hours.  Invalid input(s): FREET3 No results for input(s): VITAMINB12, FOLATE, FERRITIN, TIBC, IRON, RETICCTPCT in the last 72 hours. Coags:  No results for input(s): INR in the last 72 hours.  Invalid input(s): PT Microbiology: Recent Results (from the past 240 hour(s))  Blood culture (routine x  2)     Status: None (Preliminary result)   Collection Time: 01/07/15  5:32 PM  Result Value Ref Range Status   Specimen Description BLOOD LEFT ARM  Final   Special Requests BOTTLES DRAWN AEROBIC AND ANAEROBIC 5CC  Final   Culture   Final           BLOOD CULTURE RECEIVED NO GROWTH TO DATE CULTURE WILL BE HELD FOR 5 DAYS BEFORE ISSUING A FINAL NEGATIVE REPORT Performed at [facility]    Report Status PENDING  Incomplete  Blood culture (routine x 2)     Status: None (Preliminary result)   Collection Time: 01/07/15  5:37 PM  Result Value Ref Range Status   Specimen Description BLOOD LEFT ARM  Final   Special Requests BOTTLES DRAWN AEROBIC AND ANAEROBIC 5CC  Final   Culture   Final           BLOOD CULTURE RECEIVED NO GROWTH TO DATE CULTURE WILL BE HELD FOR 5 DAYS BEFORE ISSUING A FINAL NEGATIVE REPORT Performed at [facility]    Report Status PENDING  Incomplete  Culture, Urine     Status: None   Collection Time: 01/07/15 11:46 PM  Result Value Ref Range Status   Specimen Description URINE, RANDOM  Final   Special Requests NONE  Final   Colony Count NO GROWTH Performed at [facility]   Final   Culture NO GROWTH Performed at [facility]   Final   Report Status 01/09/2015 FINAL  Final     BRIEF HOSPITAL COURSE:  Acute hypoxic respiratory failure: Secondary to healthcare associated pneumonia, continue to taper off  oxygen. Significantly Improved compared to on admission.   Healthcare associated pneumonia: Afebrile, without leukocytosis. Patient did not have any sepsis pathophysiology on admission. Patient is nontoxic appearing. Blood cultures are negative, urine legionella antigen negative, however streptococcal antigen positive. Patient was placed on IV vancomycin and cefepime, since cultures are negative, and he has had no fever and looks nontoxic, will transition him to [facility] which he will continue for 7 more days to complete a ten-day course.  Patient will need a repeat chest x-ray in 6-8 weeks document resolution of the pneumonia given his prior history of smoking.    Acute on chronic kidney disease stage III: Acute renal failure likely prerenal azotemia, returning back to baseline with hydration.    Dehydration: Resolved with IV fluids. Encourage oral intake, euvolemic on exam.   Type 2 diabetes: CBGs stable with SSI. Last A1c on 12/17/14 was 5.9, appears to be on multiple oral hypoglycemic agents as outpatient-suspect that we can minimize oral hypoglycemics with A1c being at 5.9. I have discontinued all these other oral hypoglycemic agents with the exception of Actos. Please recheck A1c in 3 months.   Anemia: Suspect anemia of chronic disease/acute illness. Slight drop in hemoglobin secondary to IV fluid dietitian and acute illness. Continue to monitor periodically while at SNF, no overt evidence of bleeding at this time.   Recent Acetabular fracture and Right radial fracture:-Nonweight bearing right leg, continue supportive care. Will need to be discharged back to SNF in the next few days. Please follow up with orthopedics as previously scheduled   History of CVA: Nonfocal exam, continue Plavix   History of COPD: Lungs clear, continue Spiriva and nebs  TODAY-DAY OF DISCHARGE:  Subjective:   Johnathan [name] today has no headache,no chest abdominal pain,no new weakness tingling or numbness, feels much better wants to go home today.   Objective:   Blood pressure 127/48, pulse 71, temperature 98.2 F (36.8 C), temperature source Axillary, resp. rate 22, height 5\' 10"  (1.778 m), weight 72.16 kg (159 lb 1.3 oz), SpO2 97 %.  Intake/Output Summary (Last 24 hours) at 01/09/15 1029 Last data filed at 01/09/15 0921  Gross per 24 hour  Intake    985 ml  Output   1125 ml  Net   -140 ml   Filed Weights   01/08/15 0744  Weight: 72.16 kg (159 lb 1.3 oz)    Exam Awake Alert, Oriented *3, No new F.N deficits, Normal  affect [place].AT,PERRAL Supple Neck,No JVD, No cervical lymphadenopathy appriciated.  Symmetrical Chest wall movement, Good air movement bilaterally, CTAB RRR,No Gallops,Rubs or new Murmurs, No Parasternal Heave +ve B.Sounds, Abd Soft, Non tender, No organomegaly appriciated, No rebound -guarding or rigidity. No Cyanosis, Clubbing or edema, No new Rash or bruise  DISCHARGE CONDITION: Stable  DISPOSITION: SNF  DISCHARGE INSTRUCTIONS:    Activity:  As tolerated with Full fall precautions use walker/cane & assistance as needed  Diet recommendation: Diabetic Diet Heart Healthy diet  Discharge Instructions    Call MD for:  difficulty breathing, headache or visual disturbances    Complete by:  As directed      Call MD for:  temperature >100.4    Complete by:  As directed      Diet - low sodium heart healthy    Complete by:  As directed      Diet Carb Modified    Complete by:  As directed      Increase activity slowly    Complete by:  As directed            Follow-up Information    Follow up with Kaleen Mask, MD. Schedule an appointment as soon as possible for a visit in 1 week.   Specialty:  Family Medicine   Contact information:   [address] [place] [address] [phone]       Total Time spent on discharge equals 45 minutes.  SignedJeoffrey Massed 01/09/2015 10:29 AM

## 2015-01-09 NOTE — Clinical Documentation Improvement (Signed)
Presents with Pneumonia, ARF, Acute Hypoxic Respiratory Failure.   WBC = 13.1  Tachypneic and Tachycardic - RR as high as 30's, HR in 90's at times  Febrile = 100.4 oral  Hypotensive - 97/43, 81/55 in ED  Fluid resuscitation given in ED  Vancomycin and Cefepime initiated  Organ failure  Lactic Acid not drawn  Please clarify if you feel Sepsis has ruled in or out and document findings in next progress note and include in discharge summary if applicable.  Sepsis Severe Sepsis Sepsis with Septic Shock Sepsis ruled out Other Condition   Thank You, Shellee MiloEileen T Sameerah Nachtigal ,RN Clinical Documentation Specialist:  [phone]  [facility]- Health Information Management

## 2015-01-09 NOTE — Clinical Social Work Note (Signed)
Patient to be discharged to [facility]. Patient's wife, Johnathan [name], updated regarding discharge.  Facility: [facility] Report number: [id number] Transportation: EMS ([address])  [name], LCSWA [phone]) Licensed Clinical Social Worker Orthopedics [phone]([id number]) and Surgical [phone]([id number])

## 2015-01-14 LAB — CULTURE, BLOOD (ROUTINE X 2)
CULTURE: NO GROWTH
Culture: NO GROWTH

## 2015-03-31 ENCOUNTER — Other Ambulatory Visit: Payer: Self-pay | Admitting: Family Medicine

## 2015-03-31 DIAGNOSIS — R1012 Left upper quadrant pain: Secondary | ICD-10-CM

## 2015-03-31 DIAGNOSIS — R5383 Other fatigue: Secondary | ICD-10-CM

## 2015-04-02 ENCOUNTER — Other Ambulatory Visit: Payer: Medicare Other

## 2015-04-04 ENCOUNTER — Ambulatory Visit (HOSPITAL_COMMUNITY)
Admission: RE | Admit: 2015-04-04 | Discharge: 2015-04-04 | Disposition: A | Discharge status: Home / Self Care | Payer: Medicare Other | Source: Other Acute Inpatient Hospital | Attending: Family Medicine | Admitting: Family Medicine

## 2015-04-04 ENCOUNTER — Other Ambulatory Visit (HOSPITAL_COMMUNITY): Payer: Self-pay | Admitting: Family Medicine

## 2015-04-04 ENCOUNTER — Encounter (HOSPITAL_COMMUNITY): Payer: Self-pay

## 2015-04-04 ENCOUNTER — Ambulatory Visit (HOSPITAL_COMMUNITY)
Admission: RE | Admit: 2015-04-04 | Discharge: 2015-04-04 | Disposition: A | Discharge status: Home / Self Care | Payer: Medicare Other | Source: Ambulatory Visit | Attending: Family Medicine | Admitting: Family Medicine

## 2015-04-04 DIAGNOSIS — R079 Chest pain, unspecified: Secondary | ICD-10-CM | POA: Insufficient documentation

## 2015-04-04 DIAGNOSIS — R0602 Shortness of breath: Secondary | ICD-10-CM | POA: Insufficient documentation

## 2015-04-04 MED ORDER — IOHEXOL 350 MG/ML SOLN
80.0000 mL | Freq: Once | INTRAVENOUS | Status: AC | PRN
  Administered 2015-04-04: 80 mL via INTRAVENOUS

## 2016-03-10 ENCOUNTER — Other Ambulatory Visit: Payer: Self-pay | Admitting: Family Medicine

## 2016-03-10 DIAGNOSIS — Z79899 Other long term (current) drug therapy: Secondary | ICD-10-CM

## 2016-03-10 DIAGNOSIS — Z7952 Long term (current) use of systemic steroids: Secondary | ICD-10-CM

## 2016-03-30 ENCOUNTER — Other Ambulatory Visit: Payer: Medicare Other

## 2016-04-05 ENCOUNTER — Ambulatory Visit
Admission: RE | Admit: 2016-04-05 | Discharge: 2016-04-05 | Disposition: A | Discharge status: Home / Self Care | Payer: Medicare Other | Source: Ambulatory Visit | Attending: Family Medicine | Admitting: Family Medicine

## 2016-04-05 DIAGNOSIS — Z7952 Long term (current) use of systemic steroids: Secondary | ICD-10-CM

## 2017-01-11 ENCOUNTER — Emergency Department (HOSPITAL_COMMUNITY)
Admission: EM | Admit: 2017-01-11 | Discharge: 2017-01-12 | Disposition: A | Discharge status: Home / Self Care | Payer: Medicare Other | Attending: Emergency Medicine | Admitting: Emergency Medicine

## 2017-01-11 ENCOUNTER — Encounter (HOSPITAL_COMMUNITY): Payer: Self-pay

## 2017-01-11 ENCOUNTER — Emergency Department (HOSPITAL_COMMUNITY): Payer: Medicare Other

## 2017-01-11 DIAGNOSIS — R05 Cough: Secondary | ICD-10-CM | POA: Diagnosis present

## 2017-01-11 DIAGNOSIS — E1122 Type 2 diabetes mellitus with diabetic chronic kidney disease: Secondary | ICD-10-CM | POA: Diagnosis not present

## 2017-01-11 DIAGNOSIS — N179 Acute kidney failure, unspecified: Secondary | ICD-10-CM | POA: Diagnosis not present

## 2017-01-11 DIAGNOSIS — J441 Chronic obstructive pulmonary disease with (acute) exacerbation: Secondary | ICD-10-CM | POA: Insufficient documentation

## 2017-01-11 DIAGNOSIS — Z8673 Personal history of transient ischemic attack (TIA), and cerebral infarction without residual deficits: Secondary | ICD-10-CM | POA: Diagnosis not present

## 2017-01-11 DIAGNOSIS — F17210 Nicotine dependence, cigarettes, uncomplicated: Secondary | ICD-10-CM | POA: Diagnosis not present

## 2017-01-11 DIAGNOSIS — Z79899 Other long term (current) drug therapy: Secondary | ICD-10-CM | POA: Insufficient documentation

## 2017-01-11 DIAGNOSIS — R059 Cough, unspecified: Secondary | ICD-10-CM

## 2017-01-11 DIAGNOSIS — N183 Chronic kidney disease, stage 3 (moderate): Secondary | ICD-10-CM | POA: Insufficient documentation

## 2017-01-11 DIAGNOSIS — E860 Dehydration: Secondary | ICD-10-CM | POA: Diagnosis not present

## 2017-01-11 LAB — CBC WITH DIFFERENTIAL/PLATELET
BASOS PCT: 0 %
Basophils Absolute: 0 10*3/uL (ref 0.0–0.1)
EOS PCT: 0 %
Eosinophils Absolute: 0 10*3/uL (ref 0.0–0.7)
HEMATOCRIT: 37.4 % — AB (ref 39.0–52.0)
Hemoglobin: 11.9 g/dL — ABNORMAL LOW (ref 13.0–17.0)
Lymphocytes Relative: 11 %
Lymphs Abs: 1.3 10*3/uL (ref 0.7–4.0)
MCH: 31 pg (ref 26.0–34.0)
MCHC: 31.8 g/dL (ref 30.0–36.0)
MCV: 97.4 fL (ref 78.0–100.0)
Monocytes Absolute: 1 10*3/uL (ref 0.1–1.0)
Monocytes Relative: 8 %
Neutro Abs: 9.6 10*3/uL — ABNORMAL HIGH (ref 1.7–7.7)
Neutrophils Relative %: 81 %
Platelets: 160 10*3/uL (ref 150–400)
RBC: 3.84 MIL/uL — ABNORMAL LOW (ref 4.22–5.81)
RDW: 13.2 % (ref 11.5–15.5)
WBC: 11.9 10*3/uL — ABNORMAL HIGH (ref 4.0–10.5)

## 2017-01-11 LAB — COMPREHENSIVE METABOLIC PANEL
ALT: 47 U/L (ref 17–63)
AST: 32 U/L (ref 15–41)
Albumin: 3.1 g/dL — ABNORMAL LOW (ref 3.5–5.0)
Alkaline Phosphatase: 131 U/L — ABNORMAL HIGH (ref 38–126)
Anion gap: 9 (ref 5–15)
BUN: 32 mg/dL — ABNORMAL HIGH (ref 6–20)
CHLORIDE: 103 mmol/L (ref 101–111)
CO2: 25 mmol/L (ref 22–32)
CREATININE: 2.23 mg/dL — AB (ref 0.61–1.24)
Calcium: 8.9 mg/dL (ref 8.9–10.3)
GFR calc Af Amer: 31 mL/min — ABNORMAL LOW (ref >60)
GFR calc non Af Amer: 27 mL/min — ABNORMAL LOW (ref >60)
Glucose, Bld: 326 mg/dL — ABNORMAL HIGH (ref 65–99)
Potassium: 5 mmol/L (ref 3.5–5.1)
Sodium: 137 mmol/L (ref 135–145)
Total Bilirubin: 0.8 mg/dL (ref 0.3–1.2)
Total Protein: 7.3 g/dL (ref 6.5–8.1)

## 2017-01-11 LAB — INFLUENZA PANEL BY PCR (TYPE A & B)
INFLBPCR: NEGATIVE
Influenza A By PCR: NEGATIVE

## 2017-01-11 MED ORDER — SODIUM CHLORIDE 0.9 % IV BOLUS (SEPSIS)
1000.0000 mL | Freq: Once | INTRAVENOUS | Status: AC
  Administered 2017-01-11: 1000 mL via INTRAVENOUS

## 2017-01-11 MED ORDER — ALBUTEROL SULFATE HFA 108 (90 BASE) MCG/ACT IN AERS
2.0000 | INHALATION_SPRAY | RESPIRATORY_TRACT | Status: DC | PRN
  Administered 2017-01-11: 2 via RESPIRATORY_TRACT
  Filled 2017-01-11: qty 6.7

## 2017-01-11 MED ORDER — IPRATROPIUM-ALBUTEROL 0.5-2.5 (3) MG/3ML IN SOLN
3.0000 mL | Freq: Once | RESPIRATORY_TRACT | Status: AC
  Administered 2017-01-11: 3 mL via RESPIRATORY_TRACT
  Filled 2017-01-11: qty 3

## 2017-01-11 MED ORDER — AEROCHAMBER PLUS W/MASK MISC
1.0000 | Freq: Once | Status: AC
  Administered 2017-01-11: 1
  Filled 2017-01-11: qty 1

## 2017-01-11 MED ORDER — METHYLPREDNISOLONE SODIUM SUCC 125 MG IJ SOLR
125.0000 mg | Freq: Once | INTRAMUSCULAR | Status: AC
  Administered 2017-01-11: 125 mg via INTRAVENOUS
  Filled 2017-01-11: qty 2

## 2017-01-11 NOTE — ED Triage Notes (Signed)
Pt brought in by [facility] from pt's doctors office. Pt c/o of ongoing going flu like symptoms since Sunday. Pt afebrile and VSS. Per EMS, doctors read O2 at 82%. O2 Sat now at 94% on Ra. Hx of dementia. Aox4.

## 2017-01-11 NOTE — ED Provider Notes (Signed)
[facility]-EMERGENCY DEPT Provider Note   CSN: [id number] Arrival date & time: 01/11/17 1911     History    Chief Complaint  Patient presents with  . Cough     HPI Johnathan [name] is a 81 y.o. male.  81yo M w/ PMH including dementia, CVA w/ memory loss, COPD, T2DM who p/w cough and flu like sx. Wife reports that he has had cough, decreased appetite for the past 3 days. He has had low-grade fevers up to 100 and mild headache yesterday for which she has given him Tylenol. They spoke with his PCP on the phone and he went to the office for evaluation today. They were concerned of O2 saturation 82% and sent him here for further evaluation. The patient has not complained of any chest pain or severe shortness of breath. No sick contacts or recent travel. No vomiting or diarrhea.  Past Medical History:  Diagnosis Date  . Acquired deformity of left upper arm   . Closed fracture of neck of right femur with routine healing   . Closed fracture of right forearm with routine healing   . COPD (chronic obstructive pulmonary disease) (HCC)   . Diabetes mellitus without complication (HCC)   . Falls   . Fracture of right acetabulum with routine healing   . Nicotine dependence   . Stroke (HCC)   . Vitamin D deficiency      Patient Active Problem List   Diagnosis Date Noted  . HCAP (healthcare-associated pneumonia) 01/07/2015  . ARF (acute renal failure) (HCC) 01/07/2015  . Anemia 01/07/2015  . Leukocytosis 01/07/2015  . Acute respiratory failure with hypoxia (HCC) 01/07/2015  . Dehydration 01/07/2015  . CKD (chronic kidney disease), stage III 01/07/2015  . Fall   . Vitamin D deficiency 12/16/2014  . Femur fracture, right (HCC) 12/14/2014  . Right radial fracture 12/14/2014  . Diabetes mellitus (HCC) 12/14/2014  . History of CVA (cerebrovascular accident) 12/14/2014  . Smoker 12/14/2014  . Suspected clinically COPD (chronic obstructive pulmonary disease) 12/14/2014  . Dementia 12/14/2014  .  Left wrist drop 12/14/2014  . Recent Acetabular fracture 12/13/2014    Past Surgical History:  Procedure Laterality Date  . INTRAMEDULLARY (IM) NAIL INTERTROCHANTERIC Right 2005   [name]         Home Medications    Prior to Admission medications   Medication Sig Start Date End Date Taking? Authorizing Provider  benzonatate (TESSALON) 100 MG capsule Take 100 mg by mouth 3 (three) times daily as needed for cough.   Yes Historical Provider, MD  brimonidine-timolol (COMBIGAN) 0.2-0.5 % ophthalmic solution Place 1 drop into both eyes daily.   Yes Historical Provider, MD  buPROPion (WELLBUTRIN XL) 150 MG 24 hr tablet Take 150 mg by mouth daily.   Yes Historical Provider, MD  Calcium Carb-Cholecalciferol (CALCIUM-VITAMIN D) 600-400 MG-UNIT TABS Take 1 tablet by mouth daily.   Yes Historical Provider, MD  donepezil (ARICEPT) 10 MG tablet Take 10 mg by mouth at bedtime. 11/26/14  Yes Historical Provider, MD  gabapentin (NEURONTIN) 300 MG capsule Take 300 mg by mouth 2 (two) times daily.    Yes Historical Provider, MD  glimepiride (AMARYL) 4 MG tablet Take 4 mg by mouth daily with breakfast.   Yes Historical Provider, MD  iron polysaccharides (NIFEREX) 150 MG capsule Take 1 capsule (150 mg total) by mouth daily. 12/18/14  Yes Marianne L York, PA-C  multivitamin (RENA-VIT) TABS tablet Take 1 tablet by mouth daily.   Yes Historical Provider, MD  sitaGLIPtin (JANUVIA) 50 MG tablet Take 50 mg by mouth daily.   Yes Historical Provider, MD  sodium polystyrene (KAYEXALATE) 15 GM/60ML suspension Take 15 g by mouth once.   Yes Historical Provider, MD  tiotropium (SPIRIVA) 18 MCG inhalation capsule Place 1 capsule (18 mcg total) into inhaler and inhale daily. Patient taking differently: Place 18 mcg into inhaler and inhale daily as needed (for SOB).  12/18/14  Yes Marianne L York, PA-C  vitamin B-12 (CYANOCOBALAMIN) 1000 MCG tablet Take 1,000 mcg by mouth daily.   Yes Historical Provider, MD    mometasone-formoterol (DULERA) 100-5 MCG/ACT AERO Inhale 2 puffs into the lungs 2 (two) times daily. Patient taking differently: Inhale 2 puffs into the lungs 2 (two) times daily as needed for shortness of breath.  12/18/14   Tora Kindred York, PA-C  predniSONE (DELTASONE) 20 MG tablet Take 2 tablets (40 mg total) by mouth daily. 01/12/17   Laurence Spates, MD  promethazine-dextromethorphan (PROMETHAZINE-DM) 6.25-15 MG/5ML syrup Take 2.5 mLs by mouth 4 (four) times daily as needed for cough. 01/12/17   Laurence Spates, MD      History reviewed. No pertinent family history.   Social History  Substance Use Topics  . Smoking status: Current Every Day Smoker    Packs/day: 2.00    Types: Cigarettes  . Smokeless tobacco: Never Used  . Alcohol use No     Allergies     Benzocaine-triclosan; Hydrocodone; Solarcaine [benzocaine]; and Latex    Review of Systems  10 Systems reviewed and are negative for acute change except as noted in the HPI.   Physical Exam Updated Vital Signs BP 143/64   Pulse 73   Temp 98.5 F (36.9 C) (Oral)   Resp 18   SpO2 90%   Physical Exam  Constitutional: He is oriented to person, place, and time. He appears well-developed and well-nourished. No distress.  HENT:  Head: Normocephalic and atraumatic.  Mildly dry mucous membranes  Eyes: Conjunctivae are normal. Pupils are equal, round, and reactive to light.  Neck: Neck supple.  Cardiovascular: Normal rate, regular rhythm and normal heart sounds.   No murmur heard. Pulmonary/Chest: Effort normal. No respiratory distress. He has wheezes.  Diminished BS b/l with end expiratory wheezes in bases; mildly increased WOB, frequent cough  Abdominal: Soft. Bowel sounds are normal. He exhibits no distension. There is no tenderness.  Musculoskeletal: He exhibits no edema.  Neurological: He is alert and oriented to person, place, and time.  Fluent speech  Skin: Skin is warm and dry.  Psychiatric: He has a  normal mood and affect. Judgment normal.  Nursing note and vitals reviewed.     ED Treatments / Results  Labs (all labs ordered are listed, but only abnormal results are displayed) Labs Reviewed  COMPREHENSIVE METABOLIC PANEL - Abnormal; Notable for the following:       Result Value   Glucose, Bld 326 (*)    BUN 32 (*)    Creatinine, Ser 2.23 (*)    Albumin 3.1 (*)    Alkaline Phosphatase 131 (*)    GFR calc non Af Amer 27 (*)    GFR calc Af Amer 31 (*)    All other components within normal limits  CBC WITH DIFFERENTIAL/PLATELET - Abnormal; Notable for the following:    WBC 11.9 (*)    RBC 3.84 (*)    Hemoglobin 11.9 (*)    HCT 37.4 (*)    Neutro Abs 9.6 (*)    All other  components within normal limits  INFLUENZA PANEL BY PCR (TYPE A & B)     EKG  EKG Interpretation  Date/Time:    Ventricular Rate:    PR Interval:    QRS Duration:   QT Interval:    QTC Calculation:   R Axis:     Text Interpretation:           Radiology Dg Chest 2 View  Result Date: 01/11/2017 CLINICAL DATA:  Initial evaluation for acute cough, flu like symptoms. EXAM: CHEST  2 VIEW COMPARISON:  Prior radiograph from 01/07/2015. FINDINGS: Mild cardiomegaly, stable from prior. Mediastinal silhouette within normal limits. Aortic atherosclerosis noted. Lungs normally inflated. Mild scattered peribronchial thickening, likely related COPD. No consolidative airspace disease. No pulmonary edema or pleural effusion. No pneumothorax. Compression deformity within the mid thoracic spine with exaggeration the normal thoracic kyphosis, similar to prior. Osteopenia. No acute osseous abnormality identified. IMPRESSION: COPD.  No other active cardiopulmonary disease identified. Electronically Signed   By: Rise MuBenjamin  McClintock M.D.   On: 01/11/2017 20:41    Procedures Procedures (including critical care time) Procedures  Medications Ordered in ED  Medications  albuterol (PROVENTIL HFA;VENTOLIN HFA) 108 (90  Base) MCG/ACT inhaler 2 puff (2 puffs Inhalation Given 01/11/17 2348)  sodium chloride 0.9 % bolus 1,000 mL (0 mLs Intravenous Stopped 01/11/17 2154)  ipratropium-albuterol (DUONEB) 0.5-2.5 (3) MG/3ML nebulizer solution 3 mL (3 mLs Nebulization Given 01/11/17 2047)  methylPREDNISolone sodium succinate (SOLU-MEDROL) 125 mg/2 mL injection 125 mg (125 mg Intravenous Given 01/11/17 2047)  sodium chloride 0.9 % bolus 1,000 mL (0 mLs Intravenous Stopped 01/11/17 2303)  ipratropium-albuterol (DUONEB) 0.5-2.5 (3) MG/3ML nebulizer solution 3 mL (3 mLs Nebulization Given 01/11/17 2348)  aerochamber plus with mask device 1 each (1 each Other Given 01/11/17 2348)     Initial Impression / Assessment and Plan / ED Course  I have reviewed the triage vital signs and the nursing notes.  Pertinent labs & imaging results that were available during my care of the patient were reviewed by me and considered in my medical decision making (see chart for details).     Pt w/ h/o COPD p/w 3 days of viral symptoms including cough, decreased appetite, low-grade fevers. Concern for hypoxia at PCP clinic and sent here for further evaluation. He was awake and alert, nontoxic and in no acute distress on exam. He did have diminished breath sounds bilaterally with occasional faint end expiratory wheezes, mildly increased work of breathing without respiratory distress. Chest x-ray shows COPD changes with no acute infiltrate. Lab work shows glucose 326, BUN 32, creatinine 2.23 which is elevated from baseline. WBC 11.9, Hgb 11.9. I suspect dehydration as the cause of his AK I based on Janann AugustWeiss report of poor intake. Gave the patient 2 L IVF, DuoNeb, and Solu-Medrol given wheezing.  On reexamination, the patient was resting comfortably. After 2 breathing treatments, he was moving air better and with improved work of breathing. His O2 saturations were slightly lower than arrival but I suspect it was due to shunting. He has requested to go home  and feels comfortable doing so. I have emphasized the importance of following up with PCP for recheck of creatinine and stressed the importance of drinking lots of fluids at home. Provided with prednisone course and AeroChamber to use albuterol at home. Also provided with cough medication. Extensively reviewed return cautions with the patient and his wife. They voiced understanding and patient was discharged in satisfactory condition.  Final Clinical Impressions(s) / ED  Diagnoses   Final diagnoses:  COPD exacerbation (HCC)  AKI (acute kidney injury) (HCC)  Dehydration  Cough     New Prescriptions   PREDNISONE (DELTASONE) 20 MG TABLET    Take 2 tablets (40 mg total) by mouth daily.   PROMETHAZINE-DEXTROMETHORPHAN (PROMETHAZINE-DM) 6.25-15 MG/5ML SYRUP    Take 2.5 mLs by mouth 4 (four) times daily as needed for cough.       Laurence Spates, MD 01/12/17 [phone]

## 2017-01-12 MED ORDER — PREDNISONE 20 MG PO TABS: 40.0000 mg | ORAL_TABLET | Freq: Every day | ORAL | 0 refills | Status: AC

## 2017-01-12 MED ORDER — PROMETHAZINE-DM 6.25-15 MG/5ML PO SYRP: 2.5000 mL | ORAL_SOLUTION | Freq: Four times a day (QID) | ORAL | 0 refills | Status: AC | PRN

## 2017-01-12 NOTE — ED Notes (Signed)
Pt stable, understands discharge instructions, and reasons for return.   

## 2017-07-21 ENCOUNTER — Other Ambulatory Visit: Payer: Self-pay | Admitting: Internal Medicine

## 2017-07-21 DIAGNOSIS — N189 Chronic kidney disease, unspecified: Secondary | ICD-10-CM

## 2017-07-25 ENCOUNTER — Emergency Department (HOSPITAL_COMMUNITY)
Admission: EM | Admit: 2017-07-25 | Discharge: 2017-08-12 | Disposition: E | Payer: Medicare Other | Attending: Emergency Medicine | Admitting: Emergency Medicine

## 2017-07-25 ENCOUNTER — Emergency Department: Payer: Medicare Other

## 2017-07-25 ENCOUNTER — Emergency Department (HOSPITAL_COMMUNITY): Payer: Medicare Other

## 2017-07-25 ENCOUNTER — Encounter (HOSPITAL_COMMUNITY): Payer: Self-pay | Admitting: Emergency Medicine

## 2017-07-25 DIAGNOSIS — J449 Chronic obstructive pulmonary disease, unspecified: Secondary | ICD-10-CM | POA: Diagnosis not present

## 2017-07-25 DIAGNOSIS — F17210 Nicotine dependence, cigarettes, uncomplicated: Secondary | ICD-10-CM | POA: Insufficient documentation

## 2017-07-25 DIAGNOSIS — Z79899 Other long term (current) drug therapy: Secondary | ICD-10-CM | POA: Diagnosis not present

## 2017-07-25 DIAGNOSIS — E1122 Type 2 diabetes mellitus with diabetic chronic kidney disease: Secondary | ICD-10-CM | POA: Diagnosis not present

## 2017-07-25 DIAGNOSIS — Z7984 Long term (current) use of oral hypoglycemic drugs: Secondary | ICD-10-CM | POA: Diagnosis not present

## 2017-07-25 DIAGNOSIS — F0390 Unspecified dementia without behavioral disturbance: Secondary | ICD-10-CM | POA: Diagnosis not present

## 2017-07-25 DIAGNOSIS — N183 Chronic kidney disease, stage 3 (moderate): Secondary | ICD-10-CM | POA: Diagnosis not present

## 2017-07-25 DIAGNOSIS — Z8673 Personal history of transient ischemic attack (TIA), and cerebral infarction without residual deficits: Secondary | ICD-10-CM | POA: Insufficient documentation

## 2017-07-25 DIAGNOSIS — Z91040 Latex allergy status: Secondary | ICD-10-CM | POA: Diagnosis not present

## 2017-07-25 DIAGNOSIS — I469 Cardiac arrest, cause unspecified: Secondary | ICD-10-CM | POA: Insufficient documentation

## 2017-07-25 LAB — I-STAT VENOUS BLOOD GAS, ED
Acid-base deficit: 9 mmol/L — ABNORMAL HIGH (ref 0.0–2.0)
Bicarbonate: 23.3 mmol/L (ref 20.0–28.0)
O2 Saturation: 67 %
TCO2: 26 mmol/L (ref 0–100)
pCO2, Ven: 87.4 mmHg (ref 44.0–60.0)
pH, Ven: 7.034 — CL (ref 7.250–7.430)
pO2, Ven: 52 mmHg — ABNORMAL HIGH (ref 32.0–45.0)

## 2017-07-25 LAB — I-STAT CHEM 8, ED
BUN: 39 mg/dL — AB (ref 6–20)
Calcium, Ion: 1.07 mmol/L — ABNORMAL LOW (ref 1.15–1.40)
Chloride: 103 mmol/L (ref 101–111)
Creatinine, Ser: 3.2 mg/dL — ABNORMAL HIGH (ref 0.61–1.24)
Glucose, Bld: 292 mg/dL — ABNORMAL HIGH (ref 65–99)
HCT: 39 % (ref 39.0–52.0)
HEMOGLOBIN: 13.3 g/dL (ref 13.0–17.0)
Potassium: 6.9 mmol/L (ref 3.5–5.1)
Sodium: 138 mmol/L (ref 135–145)
TCO2: 24 mmol/L (ref 0–100)

## 2017-07-25 LAB — I-STAT ARTERIAL BLOOD GAS, ED
ACID-BASE EXCESS: 5 mmol/L — AB (ref 0.0–2.0)
Bicarbonate: 33.9 mmol/L — ABNORMAL HIGH (ref 20.0–28.0)
O2 SAT: 100 %
PH ART: 7.287 — AB (ref 7.350–7.450)
TCO2: 36 mmol/L (ref 0–100)
pCO2 arterial: 70.4 mmHg (ref 32.0–48.0)
pO2, Arterial: 451 mmHg — ABNORMAL HIGH (ref 83.0–108.0)

## 2017-07-25 LAB — CBC WITH DIFFERENTIAL/PLATELET
BASOS ABS: 0 10*3/uL (ref 0.0–0.1)
Basophils Relative: 0 %
EOS PCT: 3 %
Eosinophils Absolute: 0.5 10*3/uL (ref 0.0–0.7)
HEMATOCRIT: 39.5 % (ref 39.0–52.0)
Hemoglobin: 12.1 g/dL — ABNORMAL LOW (ref 13.0–17.0)
LYMPHS ABS: 2.9 10*3/uL (ref 0.7–4.0)
LYMPHS PCT: 22 %
MCH: 30.9 pg (ref 26.0–34.0)
MCHC: 30.6 g/dL (ref 30.0–36.0)
MCV: 100.8 fL — ABNORMAL HIGH (ref 78.0–100.0)
MONO ABS: 0.2 10*3/uL (ref 0.1–1.0)
Monocytes Relative: 2 %
NEUTROS ABS: 9.8 10*3/uL — AB (ref 1.7–7.7)
Neutrophils Relative %: 73 %
PLATELETS: 151 10*3/uL (ref 150–400)
RBC: 3.92 MIL/uL — ABNORMAL LOW (ref 4.22–5.81)
RDW: 13.8 % (ref 11.5–15.5)
WBC: 13.4 10*3/uL — ABNORMAL HIGH (ref 4.0–10.5)

## 2017-07-25 LAB — TROPONIN I: Troponin I: 0.05 ng/mL (ref <0.03)

## 2017-07-25 LAB — COMPREHENSIVE METABOLIC PANEL
ALBUMIN: 2.8 g/dL — AB (ref 3.5–5.0)
ALT: 90 U/L — ABNORMAL HIGH (ref 17–63)
ANION GAP: 9 (ref 5–15)
AST: 116 U/L — AB (ref 15–41)
Alkaline Phosphatase: 91 U/L (ref 38–126)
BUN: 34 mg/dL — AB (ref 6–20)
CHLORIDE: 104 mmol/L (ref 101–111)
CO2: 24 mmol/L (ref 22–32)
Calcium: 7.8 mg/dL — ABNORMAL LOW (ref 8.9–10.3)
Creatinine, Ser: 3.44 mg/dL — ABNORMAL HIGH (ref 0.61–1.24)
GFR calc Af Amer: 18 mL/min — ABNORMAL LOW (ref >60)
GFR calc non Af Amer: 16 mL/min — ABNORMAL LOW (ref >60)
GLUCOSE: 304 mg/dL — AB (ref 65–99)
POTASSIUM: 7 mmol/L — AB (ref 3.5–5.1)
Sodium: 137 mmol/L (ref 135–145)
TOTAL PROTEIN: 5.9 g/dL — AB (ref 6.5–8.1)
Total Bilirubin: 0.5 mg/dL (ref 0.3–1.2)

## 2017-07-25 LAB — I-STAT TROPONIN, ED: TROPONIN I, POC: 0.05 ng/mL (ref 0.00–0.08)

## 2017-07-25 LAB — PHOSPHORUS: Phosphorus: 8.5 mg/dL — ABNORMAL HIGH (ref 2.5–4.6)

## 2017-07-25 LAB — MAGNESIUM: MAGNESIUM: 2.1 mg/dL (ref 1.7–2.4)

## 2017-07-25 LAB — I-STAT CG4 LACTIC ACID, ED: Lactic Acid, Venous: 4.99 mmol/L (ref 0.5–1.9)

## 2017-07-25 MED ORDER — EPINEPHRINE PF 1 MG/10ML IJ SOSY
PREFILLED_SYRINGE | INTRAMUSCULAR | Status: AC | PRN
  Administered 2017-07-25: 1 via INTRAVENOUS

## 2017-07-25 MED ORDER — NOREPINEPHRINE BITARTRATE 1 MG/ML IV SOLN
0.0000 ug/min | Freq: Once | INTRAVENOUS | Status: AC
  Administered 2017-07-25: 5 ug/min via INTRAVENOUS

## 2017-07-25 MED ORDER — SODIUM BICARBONATE 8.4 % IV SOLN
INTRAVENOUS | Status: AC | PRN
  Administered 2017-07-25: 50 meq via INTRAVENOUS

## 2017-07-25 MED ORDER — SODIUM BICARBONATE 8.4 % IV SOLN
INTRAVENOUS | Status: AC
  Filled 2017-07-25: qty 50

## 2017-07-25 MED ORDER — EPINEPHRINE PF 1 MG/10ML IJ SOSY
PREFILLED_SYRINGE | INTRAMUSCULAR | Status: AC | PRN
  Administered 2017-07-25 (×4): 1 via INTRAVENOUS

## 2017-07-25 MED ORDER — CALCIUM CHLORIDE 10 % IV SOLN
INTRAVENOUS | Status: AC | PRN
  Administered 2017-07-25: 1 g via INTRAVENOUS

## 2017-07-25 MED ORDER — SODIUM BICARBONATE 8.4 % IV SOLN
INTRAVENOUS | Status: AC | PRN
  Administered 2017-07-25 (×2): 50 meq via INTRAVENOUS

## 2017-07-25 MED ORDER — EPINEPHRINE PF 1 MG/ML IJ SOLN
0.5000 ug/min | INTRAMUSCULAR | Status: DC
  Filled 2017-07-25: qty 4

## 2017-07-25 MED ORDER — STERILE WATER FOR INJECTION IV SOLN
INTRAVENOUS | Status: DC
  Filled 2017-07-25: qty 850

## 2017-07-25 MED ORDER — SODIUM CHLORIDE 0.9 % IV SOLN
INTRAVENOUS | Status: AC | PRN
  Administered 2017-07-25: 1000 mL via INTRAVENOUS

## 2017-07-25 MED FILL — Medication: Qty: 1 | Status: AC

## 2017-07-30 LAB — CULTURE, BLOOD (ROUTINE X 2)
Culture: NO GROWTH
Culture: NO GROWTH
SPECIAL REQUESTS: ADEQUATE
Special Requests: ADEQUATE

## 2017-08-12 NOTE — ED Triage Notes (Signed)
[facility].  Patient had a witnessed arrest by wife after having severe shortness of breath.  Patient was down less than 2 minutes when CPR started, after CPR and epi with EMS, ROSC was obtained 20 mins after initial arrest.  Pulses were lost 30 minutes later, more epi and CPR and ROSC was obtained about 7-8 minutes later.  Total of 7 epis and was placed on an Epi drip at 4 mcg and down to 2mcg.  Patient was intubated and given 2.5 Versed and 50 mcg Fentanyl.  Capnography never got lower than 44, as high as 78.

## 2017-08-12 NOTE — Code Documentation (Signed)
Patient given epi due to low HR of 45.

## 2017-08-12 NOTE — ED Provider Notes (Signed)
[facility]-EMERGENCY DEPT Provider Note   CSN: [id number] Arrival date & time: 2017-08-01  0443     History   Chief Complaint Chief Complaint  Patient presents with  . Cardiac Arrest    HPI Johnathan [name] is a 81 y.o. male.  HPI LEVEL 5 CAVEAT FOR UNRESPONSIVE PATIENT  Pt comes in post arrest. Per EMS, pt woke up complaining of shortness of breath in the middle of the night and then had a cardiac arrest witnessed by the wife. EMS was called. Seems like CPR was started on scene. Pt received 20 minutes of CPR and then there was ROSC. Pt arrested again and CPR initiated one more time and pt arrived to the ER with a pulse.  Pt has COPD hx.  Past Medical History:  Diagnosis Date  . Acquired deformity of left upper arm   . Closed fracture of neck of right femur with routine healing   . Closed fracture of right forearm with routine healing   . COPD (chronic obstructive pulmonary disease) (HCC)   . Diabetes mellitus without complication (HCC)   . Falls   . Fracture of right acetabulum with routine healing   . Nicotine dependence   . Stroke (HCC)   . Vitamin D deficiency     Patient Active Problem List   Diagnosis Date Noted  . HCAP (healthcare-associated pneumonia) 01/07/2015  . ARF (acute renal failure) (HCC) 01/07/2015  . Anemia 01/07/2015  . Leukocytosis 01/07/2015  . Acute respiratory failure with hypoxia (HCC) 01/07/2015  . Dehydration 01/07/2015  . CKD (chronic kidney disease), stage III 01/07/2015  . Fall   . Vitamin D deficiency 12/16/2014  . Femur fracture, right (HCC) 12/14/2014  . Right radial fracture 12/14/2014  . Diabetes mellitus (HCC) 12/14/2014  . History of CVA (cerebrovascular accident) 12/14/2014  . Smoker 12/14/2014  . Suspected clinically COPD (chronic obstructive pulmonary disease) 12/14/2014  . Dementia 12/14/2014  . Left wrist drop 12/14/2014  . Recent Acetabular fracture 12/13/2014    Past Surgical History:  Procedure Laterality Date  .  INTRAMEDULLARY (IM) NAIL INTERTROCHANTERIC Right 2005   [name]        Home Medications    Prior to Admission medications   Medication Sig Start Date End Date Taking? Authorizing Provider  benzonatate (TESSALON) 100 MG capsule Take 100 mg by mouth 3 (three) times daily as needed for cough.    [provider]  brimonidine-timolol (COMBIGAN) 0.2-0.5 % ophthalmic solution Place 1 drop into both eyes daily.    [provider]  buPROPion (WELLBUTRIN XL) 150 MG 24 hr tablet Take 150 mg by mouth daily.    [provider]  Calcium Carb-Cholecalciferol (CALCIUM-VITAMIN D) 600-400 MG-UNIT TABS Take 1 tablet by mouth daily.    [provider]  donepezil (ARICEPT) 10 MG tablet Take 10 mg by mouth at bedtime. 11/26/14   [provider]  gabapentin (NEURONTIN) 300 MG capsule Take 300 mg by mouth 2 (two) times daily.     [provider]  glimepiride (AMARYL) 4 MG tablet Take 4 mg by mouth daily with breakfast.    [provider]  iron polysaccharides (NIFEREX) 150 MG capsule Take 1 capsule (150 mg total) by mouth daily. 12/18/14   Dellinger, Tora Kindred, PA-C  mometasone-formoterol (DULERA) 100-5 MCG/ACT AERO Inhale 2 puffs into the lungs 2 (two) times daily. Patient taking differently: Inhale 2 puffs into the lungs 2 (two) times daily as needed for shortness of breath.  12/18/14   Dellinger, Clerance Lav  L, PA-C  multivitamin (RENA-VIT) TABS tablet Take 1 tablet by mouth daily.    [provider]  predniSONE (DELTASONE) 20 MG tablet Take 2 tablets (40 mg total) by mouth daily. 01/12/17   Little, Ambrose Finland, MD  promethazine-dextromethorphan (PROMETHAZINE-DM) 6.25-15 MG/5ML syrup Take 2.5 mLs by mouth 4 (four) times daily as needed for cough. 01/12/17   Little, Ambrose Finland, MD  sitaGLIPtin (JANUVIA) 50 MG tablet Take 50 mg by mouth daily.    [provider]  sodium polystyrene (KAYEXALATE) 15 GM/60ML suspension Take 15 g by mouth once.     [provider]  tiotropium (SPIRIVA) 18 MCG inhalation capsule Place 1 capsule (18 mcg total) into inhaler and inhale daily. Patient taking differently: Place 18 mcg into inhaler and inhale daily as needed (for SOB).  12/18/14   Dellinger, Tora Kindred, PA-C  vitamin B-12 (CYANOCOBALAMIN) 1000 MCG tablet Take 1,000 mcg by mouth daily.    [provider]    Family History No family history on file.  Social History Social History  Substance Use Topics  . Smoking status: Current Every [name] Smoker    Packs/[name]: 2.00    Types: Cigarettes  . Smokeless tobacco: Never Used  . Alcohol use No     Allergies   Benzocaine-triclosan; Hydrocodone; Solarcaine [benzocaine]; and Latex   Review of Systems Review of Systems  Unable to perform ROS: Patient unresponsive     Physical Exam Updated Vital Signs BP (!) 64/18   Pulse (!) 109   Resp (!) 28   Wt 72.6 kg (160 lb)   SpO2 100%   BMI 22.96 kg/m   Physical Exam  Constitutional: He appears well-developed.  HENT:  Head: Atraumatic.  Eyes:  2 mm and equal  Neck: Neck supple.  Cardiovascular: Normal rate.   Pulmonary/Chest:  Intubated, breathing over the vent  Abdominal: Soft. He exhibits distension.  Neurological:  GCS - 3 Myoclonic jerking of all extremities intermittently  Skin: Skin is warm.  Nursing note and vitals reviewed.    ED Treatments / Results  Labs (all labs ordered are listed, but only abnormal results are displayed) Labs Reviewed  TROPONIN I - Abnormal; Notable for the following:       Result Value   Troponin I 0.05 (*)    All other components within normal limits  COMPREHENSIVE METABOLIC PANEL - Abnormal; Notable for the following:    Potassium 7.0 (*)    Glucose, Bld 304 (*)    BUN 34 (*)    Creatinine, Ser 3.44 (*)    Calcium 7.8 (*)    Total Protein 5.9 (*)    Albumin 2.8 (*)    AST 116 (*)    ALT 90 (*)    GFR calc non Af Amer 16 (*)    GFR calc Af Amer 18 (*)    All other  components within normal limits  CBC WITH DIFFERENTIAL/PLATELET - Abnormal; Notable for the following:    WBC 13.4 (*)    RBC 3.92 (*)    Hemoglobin 12.1 (*)    MCV 100.8 (*)    Neutro Abs 9.8 (*)    All other components within normal limits  PHOSPHORUS - Abnormal; Notable for the following:    Phosphorus 8.5 (*)    All other components within normal limits  I-STAT CHEM 8, ED - Abnormal; Notable for the following:    Potassium 6.9 (*)    BUN 39 (*)    Creatinine, Ser 3.20 (*)  Glucose, Bld 292 (*)    Calcium, Ion 1.07 (*)    All other components within normal limits  I-STAT CG4 LACTIC ACID, ED - Abnormal; Notable for the following:    Lactic Acid, Venous 4.99 (*)    All other components within normal limits  I-STAT ARTERIAL BLOOD GAS, ED - Abnormal; Notable for the following:    pH, Arterial 7.287 (*)    pCO2 arterial 70.4 (*)    pO2, Arterial 451.0 (*)    Bicarbonate 33.9 (*)    Acid-Base Excess 5.0 (*)    All other components within normal limits  I-STAT VENOUS BLOOD GAS, ED - Abnormal; Notable for the following:    pH, Ven 7.034 (*)    pCO2, Ven 87.4 (*)    pO2, Ven 52.0 (*)    Acid-base deficit 9.0 (*)    All other components within normal limits  CULTURE, BLOOD (ROUTINE X 2)  CULTURE, BLOOD (ROUTINE X 2)  MAGNESIUM  I-STAT TROPONIN, ED    EKG  EKG Interpretation None     ED ECG REPORT   Date: 08/12/17  Rate: 62  Rhythm: junctional rhythm  QRS Axis: normal  Intervals: normal  ST/T Wave abnormalities: nonspecific ST/T changes  Conduction Disutrbances:none  Narrative Interpretation:   Old EKG Reviewed: changes noted  I have personally reviewed the EKG tracing and agree with the computerized printout as noted.   Radiology Dg Chest Portable 1 View  Result Date: 08/12/2017 CLINICAL DATA:  Status post cardiac arrest, with CPR. Endotracheal tube placement. Initial encounter. EXAM: PORTABLE CHEST 1 VIEW COMPARISON:  Chest radiograph performed 01/11/2017  FINDINGS: The patient's endotracheal tube is seen ending 2-3 cm above the carina. The lungs are well expanded and appear relatively clear. The left costophrenic angle is incompletely imaged on this study. No pleural effusion or pneumothorax is seen. The cardiomediastinal silhouette is borderline normal in size. No acute osseous abnormalities are identified. External pacing pads are noted. IMPRESSION: 1. Endotracheal tube seen ending 2-3 cm above the carina. 2. Lungs clear bilaterally. Electronically Signed   By: Roanna Raider M.D.   On: 08/12/2017 05:25    Procedures ARTERIAL LINE Date/Time: August 12, 2017 7:15 AM Performed by: Derwood Kaplan Authorized by: Derwood Kaplan   Consent:    Consent obtained:  Emergent situation Indications:    Indications: hemodynamic monitoring and multiple ABGs   Pre-procedure details:    Skin preparation:  2% Chlorhexidine   Preparation: Patient was prepped and draped in sterile fashion   Anesthesia (see MAR for exact dosages):    Anesthesia method:  None Procedure details:    Location:  L femoral   Allen's test performed: no     Needle gauge:  18 G   Placement technique:  Seldinger   Number of attempts:  1   Transducer: waveform confirmed   Post-procedure details:    Post-procedure:  Biopatch applied, secured with tape, sterile dressing applied and sutured   Patient tolerance of procedure:  Tolerated well, no immediate complications   (including critical care time)  Cardiopulmonary Resuscitation (CPR) Procedure Note Directed/Performed by: Derwood Kaplan I personally directed ancillary staff and/or performed CPR in an effort to regain return of spontaneous circulation and to maintain cardiac, neuro and systemic perfusion.    INTUBATION Performed by: Derwood Kaplan  Required items: required blood products, implants, devices, and special equipment available Patient identity confirmed: provided demographic data and hospital-assigned identification  number Time out: Immediately prior to procedure a "time out" was called to verify  the correct patient, procedure, equipment, support staff and site/side marked as required.  Indications: airway control  Intubation method: Glidescope Laryngoscopy   Preoxygenation: BVM  Sedatives: none Paralytic: none  Tube Size: 8-0 cuffed  Post-procedure assessment: chest rise and ETCO2 monitor Breath sounds: equal and absent over the epigastrium Tube secured with: ETT holder Chest x-ray interpreted by radiologist and me.  Chest x-ray findings: endotracheal tube in appropriate position  Patient tolerated the procedure well with no immediate complications.      Medications Ordered in ED Medications  EPINEPHrine (ADRENALIN) 1 MG/10ML injection (1 Syringe Intravenous Given March 08, 2017 0500)  0.9 %  sodium chloride infusion ( Intravenous Stopped March 08, 2017 0555)  sodium bicarbonate injection ( Intravenous Canceled Entry March 08, 2017 0600)  EPINEPHrine (ADRENALIN) 1 MG/10ML injection (1 Syringe Intravenous Given March 08, 2017 0519)  sodium bicarbonate injection (50 mEq Intravenous Given March 08, 2017 0521)  norepinephrine (LEVOPHED) 4 mg in dextrose 5 % 250 mL (0.016 mg/mL) infusion (0 mcg/min Intravenous Stopped March 08, 2017 0555)  EPINEPHrine (ADRENALIN) 1 MG/10ML injection (1 Syringe Intravenous Given March 08, 2017 0551)  sodium bicarbonate injection (50 mEq Intravenous Given March 08, 2017 0545)  calcium chloride injection (1 g Intravenous Given March 08, 2017 0547)     Initial Impression / Assessment and Plan / ED Course  I have reviewed the triage vital signs and the nursing notes.  Pertinent labs & imaging results that were available during my care of the patient were reviewed by me and considered in my medical decision making (see chart for details).     Pt comes in post arrest. Pt arrived with a junctional rhythm, weak pulse. Soon after pt went into cardiac arrest again, but we had ROSC after 1 round of CPR. Pt's ET tube had  to be replaced, as pt was not getting good volumes on vent, and the eval showed that the tube was not completely in. Arterial line was placed after the ROSC. PT then went into another cardiac arrest. CCM was at bedside, and discussed the care with the family. After multiple rounds of CPR and epi, patient was eventually pronounced diseases at 5:52 am. CCM informed family.  Final Clinical Impressions(s) / ED Diagnoses   Final diagnoses:  Cardiac arrest [facility](HCC)    New Prescriptions New Prescriptions   No medications on file     Derwood KaplanNanavati, Kyron Schlitt, MD 0March 28, 2018 [phone]

## 2017-08-12 NOTE — Code Documentation (Signed)
Patient time of death occurred at [id number] by Dr Rhunette CroftNanavati.

## 2017-08-12 NOTE — Progress Notes (Signed)
   Jun 29, 2017 0600  Clinical Encounter Type  Visited With Patient and family together;Health care provider  Visit Type Initial;Death  Referral From Nurse  Consult/Referral To Chaplain  Spiritual Encounters  Spiritual Needs Emotional;Grief support  Stress Factors  Patient Stress Factors None identified  Family Stress Factors Exhausted;Health changes;Loss    Chaplain responded to page for death. Wife in consult room awaiting other family. Other family arrived and wife informed of patient's passing. Family appropriately emoting and waiting for patient to be cleaned up for viewing. Provided emotional support and ministry of presence. Jina Olenick L. Salomon FickBanks, MDiv

## 2017-08-12 NOTE — Code Documentation (Signed)
Pulse check, no pulse.  

## 2017-08-12 DEATH — deceased

## 2019-01-04 IMAGING — CR DG CHEST 2V
2 series · 2 of 2 positions shown · non-contrast
Comparison: Prior radiograph from 01/07/2015.

CLINICAL DATA: Initial evaluation for acute cough, flu like
symptoms.

EXAM:
CHEST  2 VIEW

[chest pa]
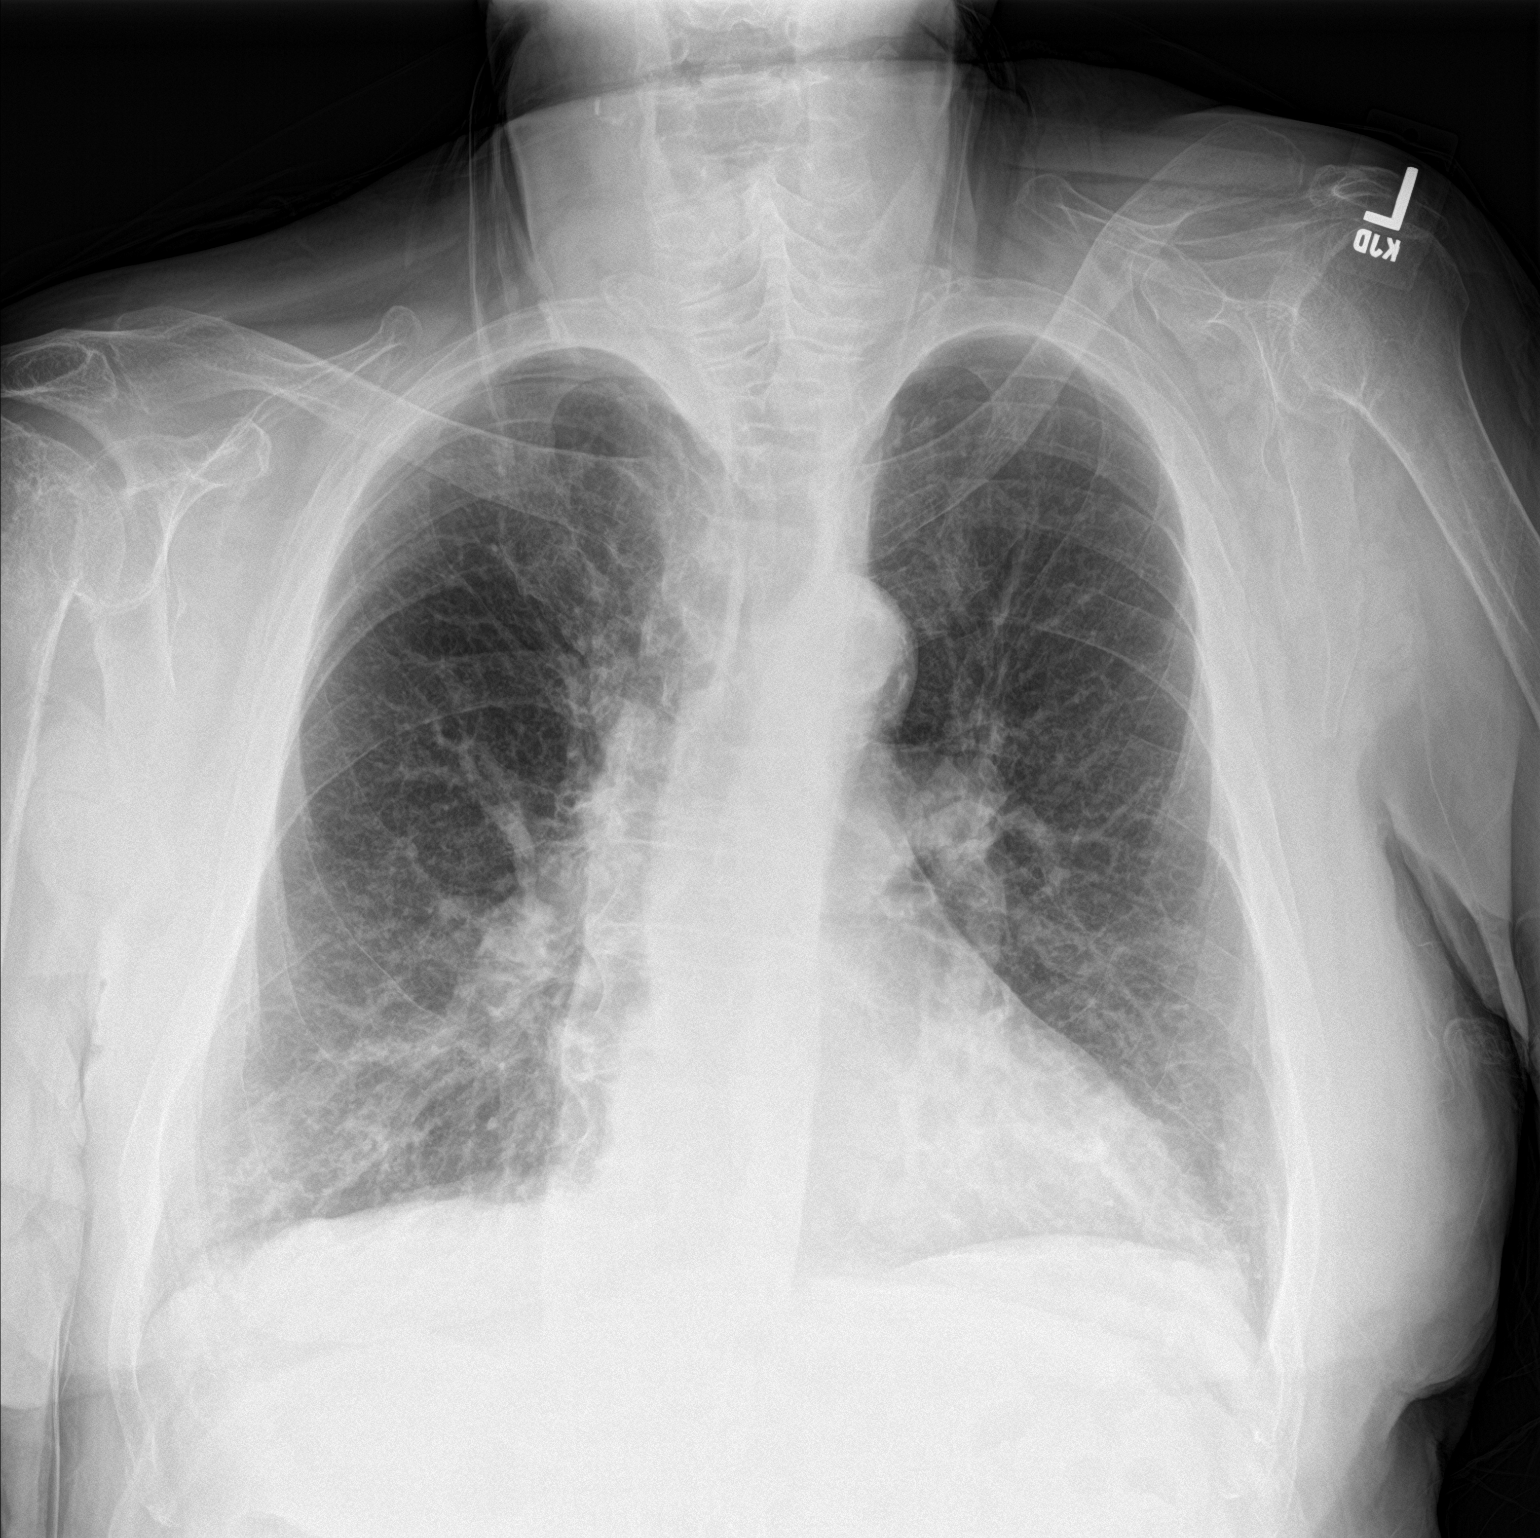

[chest lat]
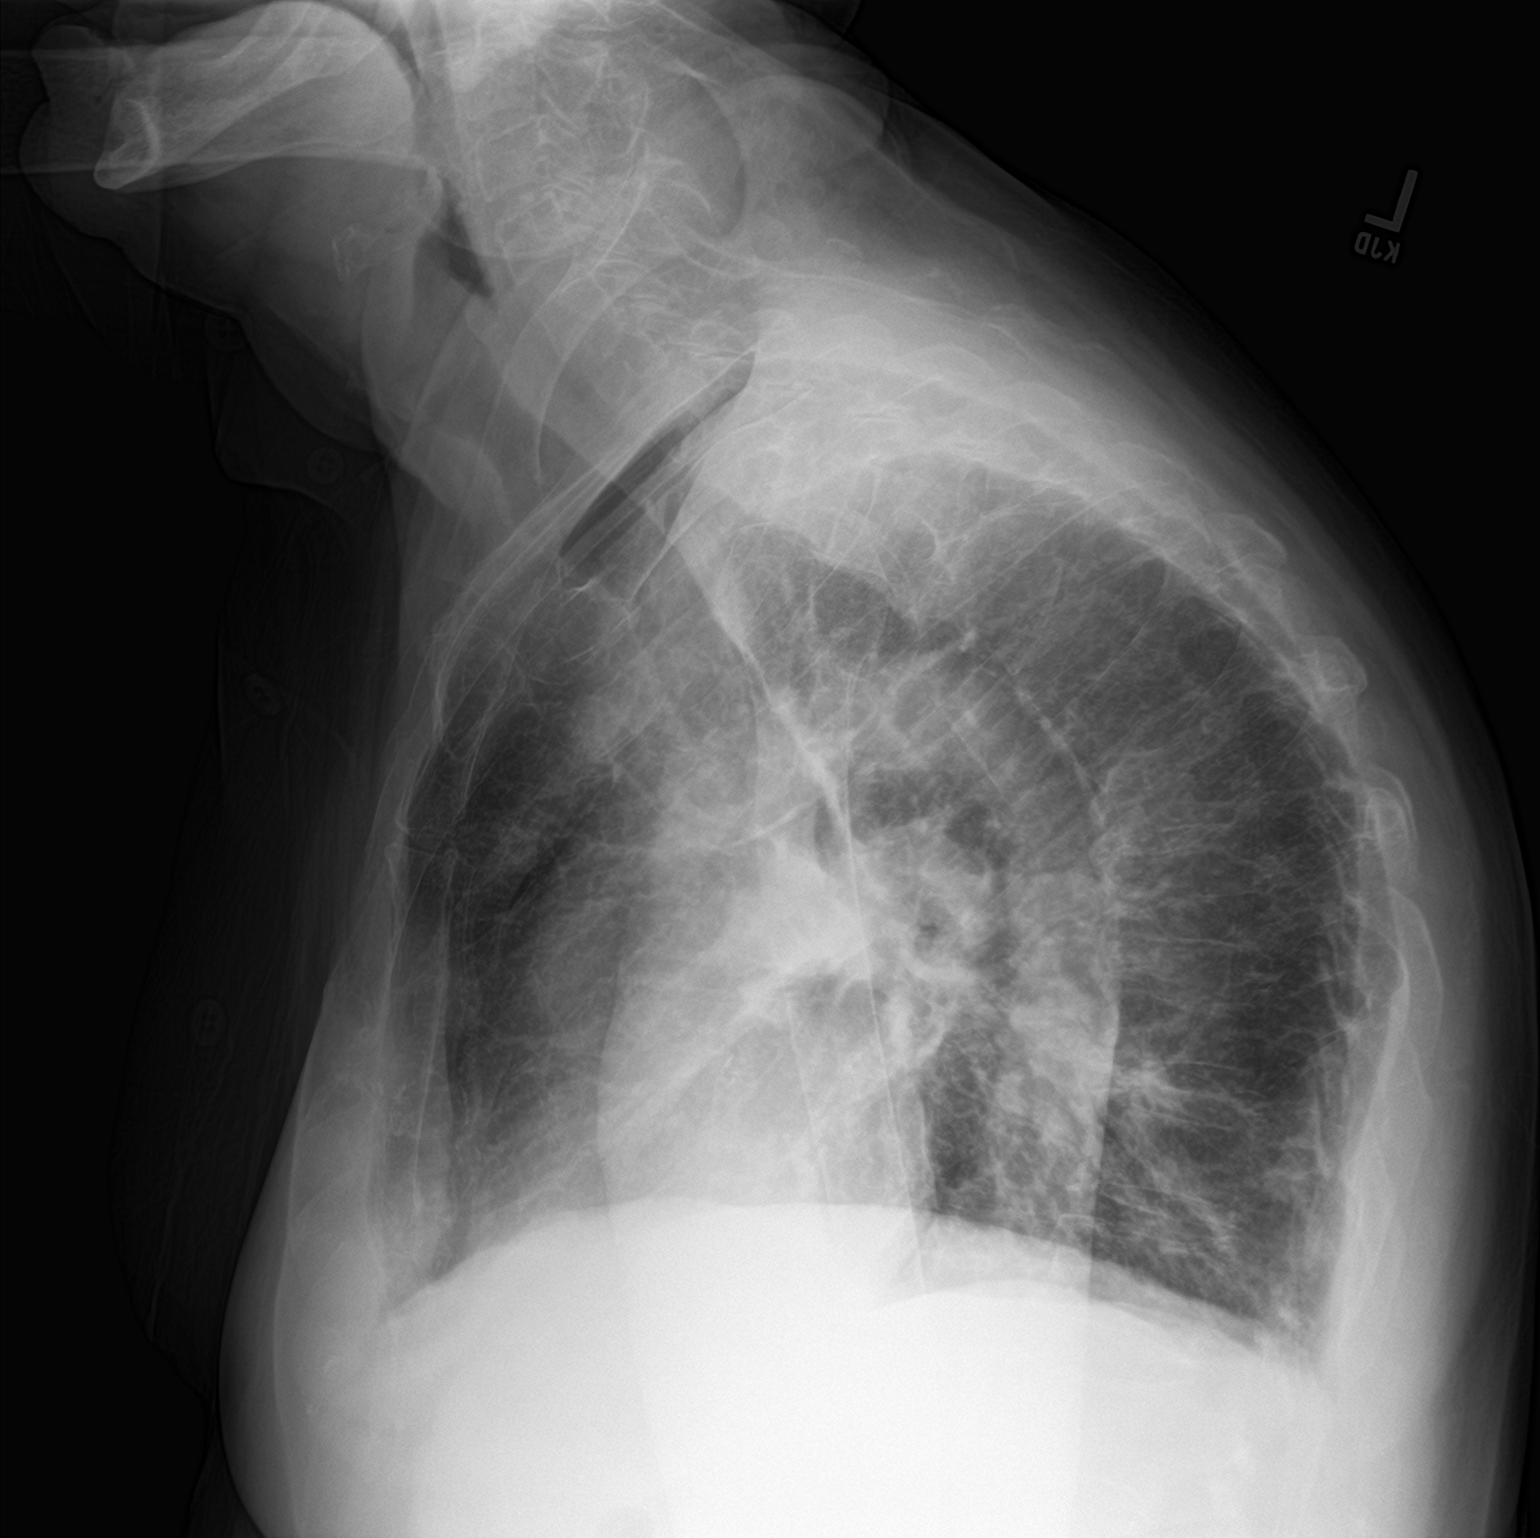

[2 of 2 positions shown; findings below may reference images not displayed]

FINDINGS: Mild cardiomegaly, stable from prior. Mediastinal silhouette within
normal limits. Aortic atherosclerosis noted.

Lungs normally inflated. Mild scattered peribronchial thickening,
likely related COPD. No consolidative airspace disease. No pulmonary
edema or pleural effusion. No pneumothorax.

Compression deformity within the mid thoracic spine with
exaggeration the normal thoracic kyphosis, similar to prior.
Osteopenia. No acute osseous abnormality identified.
IMPRESSION: COPD.  No other active cardiopulmonary disease identified.

## 2019-07-18 IMAGING — DX DG CHEST 1V PORT
1 series · 2 of 2 positions shown · non-contrast
Comparison: Chest radiograph performed 01/11/2017

CLINICAL DATA: Status post cardiac arrest, with CPR. Endotracheal
tube placement. Initial encounter.

EXAM:
PORTABLE CHEST 1 VIEW

[Series 1: chest ap · 0.14mm/px · 2 of 2 slices shown]
[im 1/2]
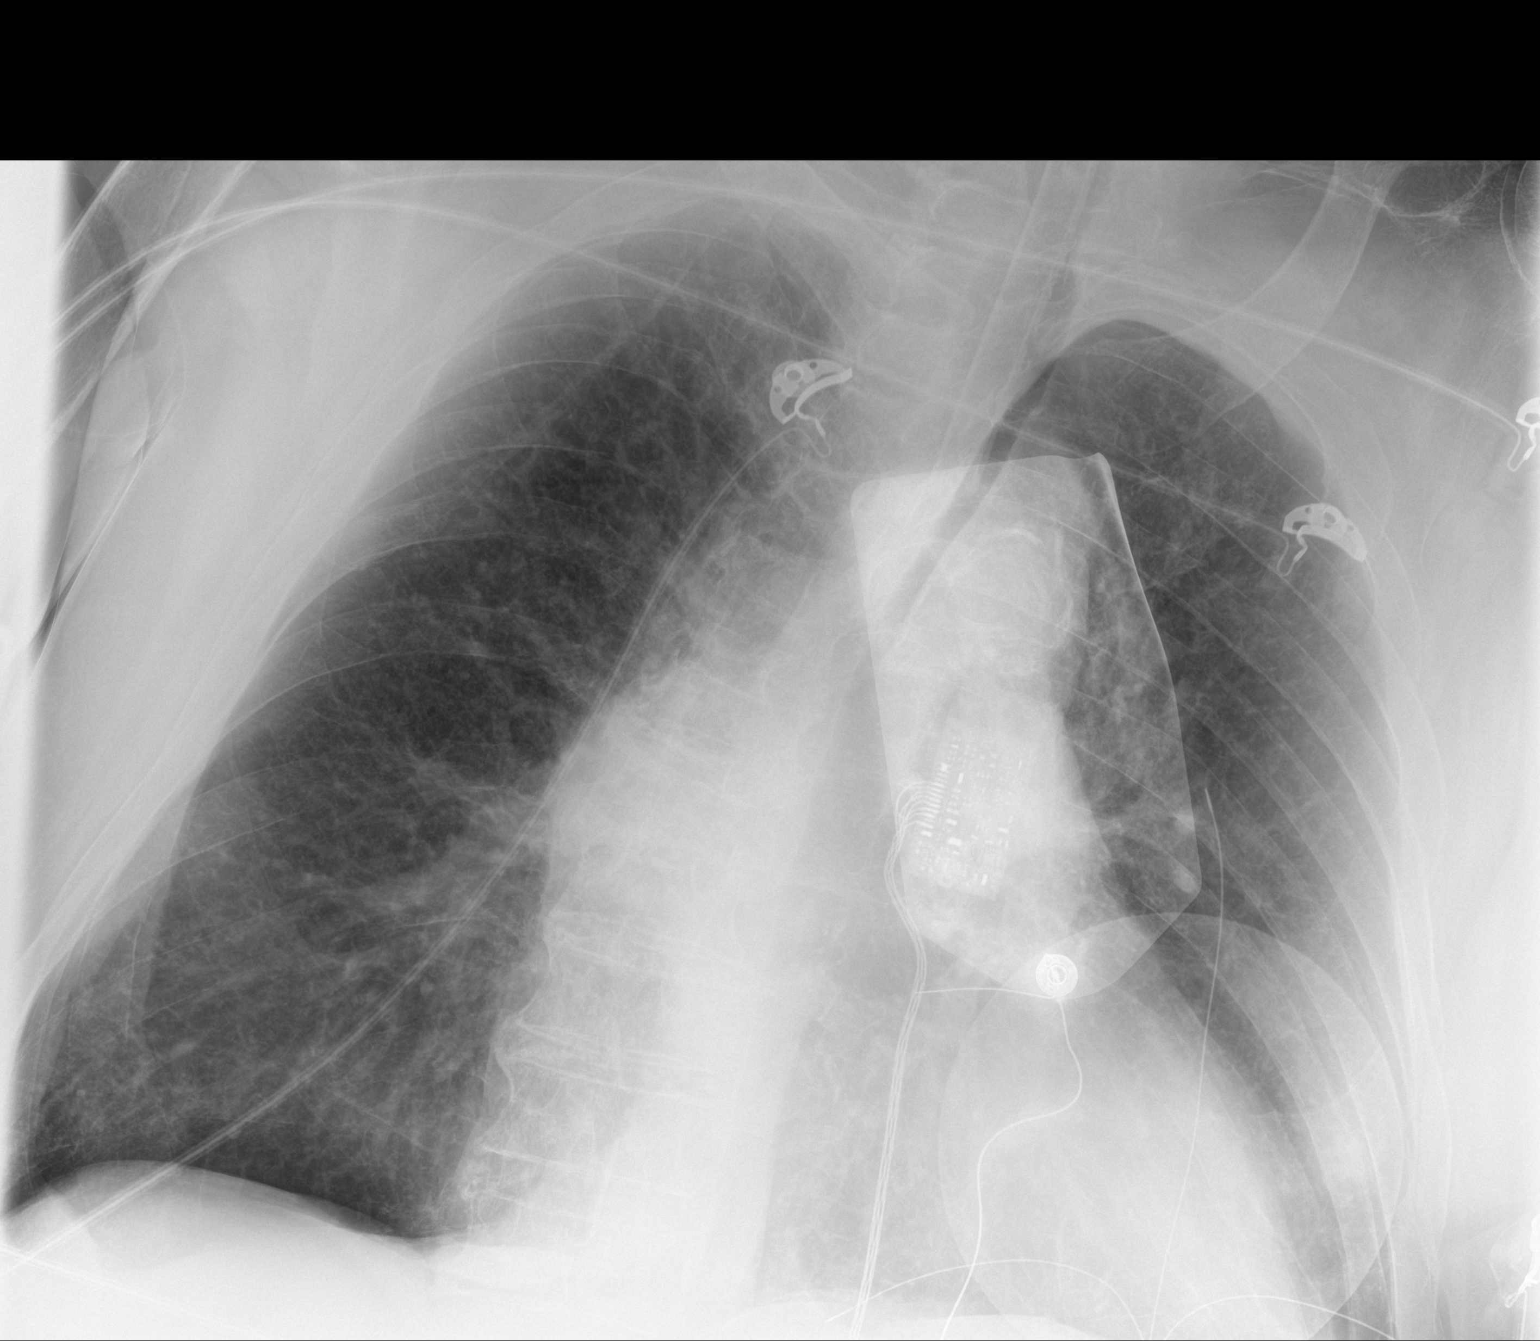
[im 2/2]
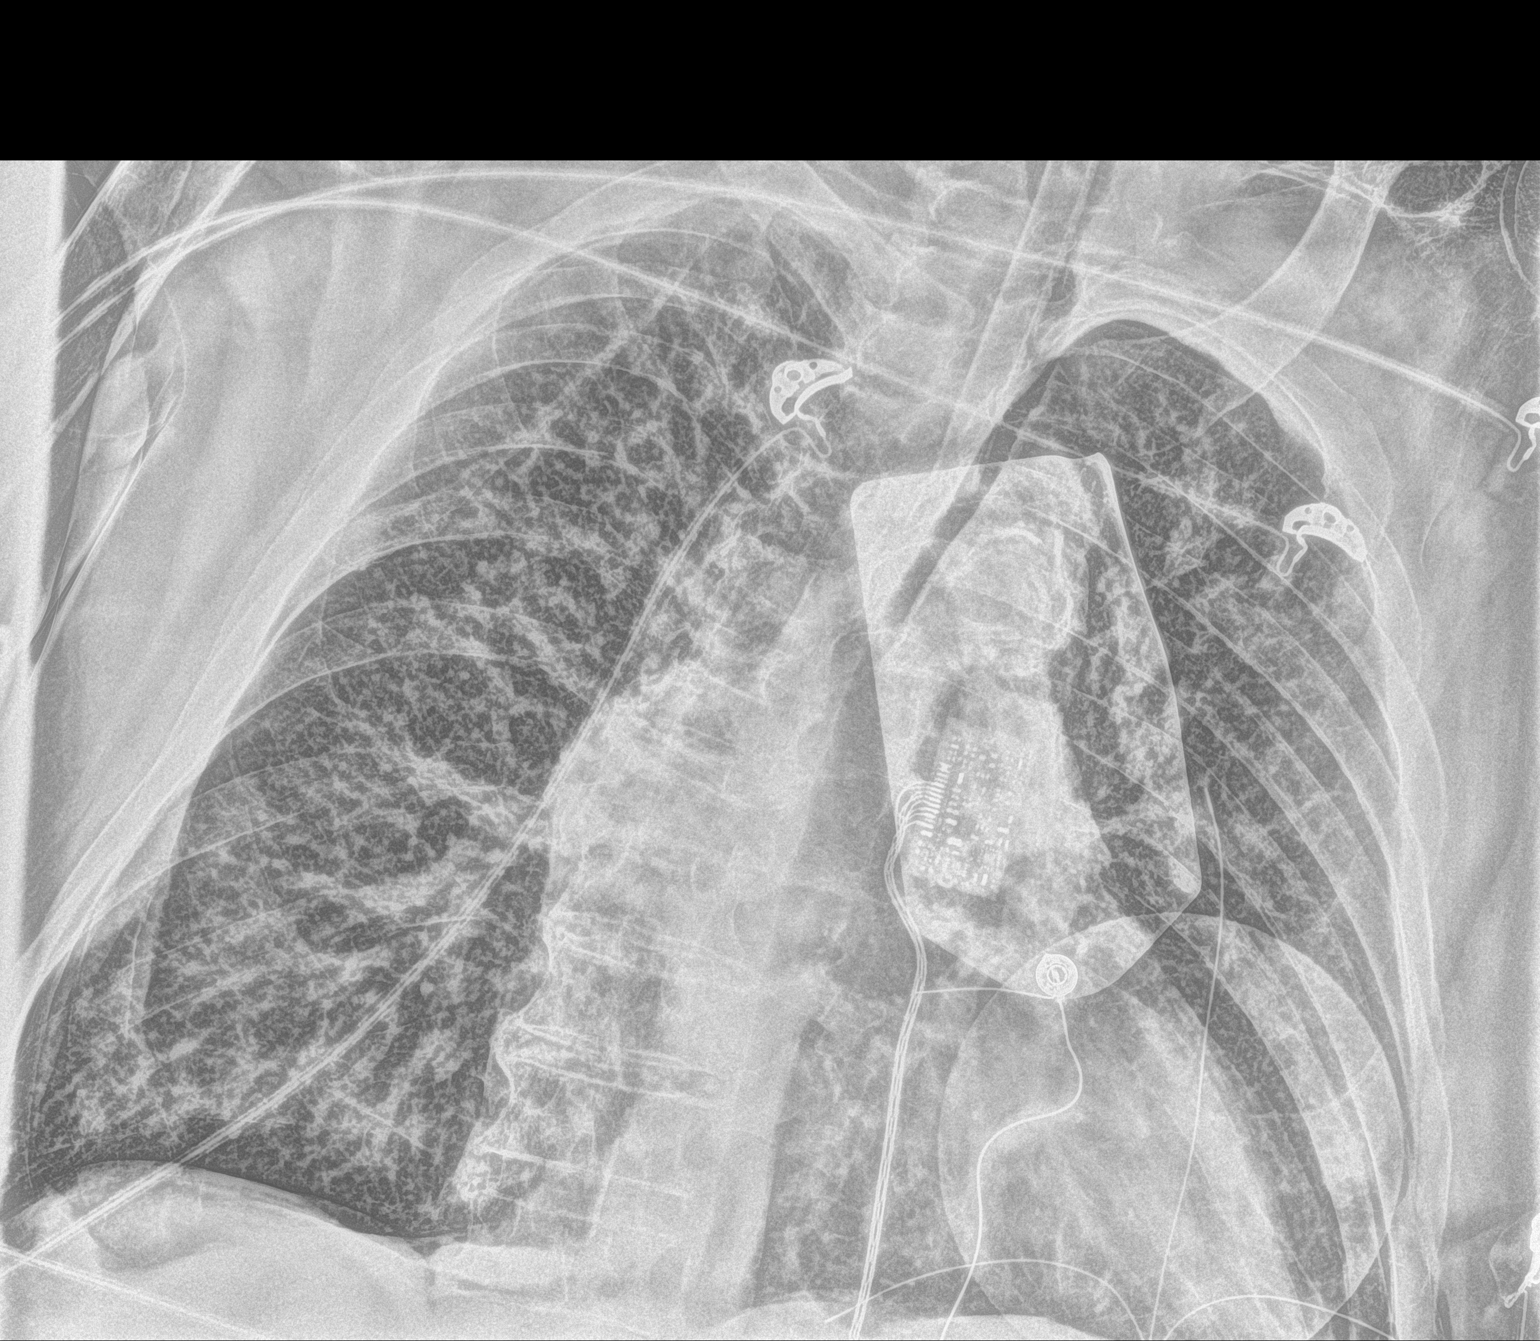

[2 of 2 positions shown; findings below may reference images not displayed]

FINDINGS: The patient's endotracheal tube is seen ending 2-3 cm above the
carina.

The lungs are well expanded and appear relatively clear. The left
costophrenic angle is incompletely imaged on this study. No pleural
effusion or pneumothorax is seen.

The cardiomediastinal silhouette is borderline normal in size. No
acute osseous abnormalities are identified. External pacing pads are
noted.
IMPRESSION: 1. Endotracheal tube seen ending 2-3 cm above the carina.
2. Lungs clear bilaterally.
